# Patient Record
Sex: Female | Born: 1955 | Race: White | Hispanic: No | Marital: Single | Smoking: Former smoker
Health system: Southern US, Community
[De-identification: ages and names within clinical notes are randomized; demographics above are authoritative.]

## PROBLEM LIST (undated history)

## (undated) DIAGNOSIS — E785 Hyperlipidemia, unspecified: Secondary | ICD-10-CM

## (undated) DIAGNOSIS — M810 Age-related osteoporosis without current pathological fracture: Secondary | ICD-10-CM

## (undated) DIAGNOSIS — I872 Venous insufficiency (chronic) (peripheral): Secondary | ICD-10-CM

## (undated) DIAGNOSIS — I809 Phlebitis and thrombophlebitis of unspecified site: Secondary | ICD-10-CM

## (undated) DIAGNOSIS — C801 Malignant (primary) neoplasm, unspecified: Secondary | ICD-10-CM

## (undated) DIAGNOSIS — K219 Gastro-esophageal reflux disease without esophagitis: Secondary | ICD-10-CM

## (undated) HISTORY — DX: Venous insufficiency (chronic) (peripheral): I87.2

## (undated) HISTORY — DX: Hyperlipidemia, unspecified: E78.5

## (undated) HISTORY — DX: Age-related osteoporosis without current pathological fracture: M81.0

## (undated) HISTORY — DX: Phlebitis and thrombophlebitis of unspecified site: I80.9

## (undated) HISTORY — DX: Gastro-esophageal reflux disease without esophagitis: K21.9

---

## 1981-12-05 DIAGNOSIS — I809 Phlebitis and thrombophlebitis of unspecified site: Secondary | ICD-10-CM

## 1981-12-05 HISTORY — DX: Phlebitis and thrombophlebitis of unspecified site: I80.9

## 2004-11-25 ENCOUNTER — Ambulatory Visit: Payer: Self-pay | Admitting: Obstetrics and Gynecology

## 2005-12-05 HISTORY — PX: CHOLECYSTECTOMY: SHX55

## 2005-12-05 HISTORY — PX: LAPAROSCOPY: SHX197

## 2005-12-27 ENCOUNTER — Ambulatory Visit: Payer: Self-pay | Admitting: Obstetrics and Gynecology

## 2006-06-02 ENCOUNTER — Emergency Department: Payer: Self-pay | Admitting: Emergency Medicine

## 2006-06-05 ENCOUNTER — Other Ambulatory Visit: Payer: Self-pay

## 2006-06-05 ENCOUNTER — Emergency Department: Payer: Self-pay | Admitting: Emergency Medicine

## 2006-06-06 ENCOUNTER — Ambulatory Visit: Payer: Self-pay | Admitting: Emergency Medicine

## 2006-07-12 ENCOUNTER — Ambulatory Visit: Payer: Self-pay | Admitting: Family Medicine

## 2006-07-31 ENCOUNTER — Ambulatory Visit: Payer: Self-pay | Admitting: Gastroenterology

## 2006-09-29 ENCOUNTER — Ambulatory Visit: Payer: Self-pay | Admitting: General Surgery

## 2006-10-12 ENCOUNTER — Ambulatory Visit: Payer: Self-pay | Admitting: General Surgery

## 2006-11-14 ENCOUNTER — Ambulatory Visit: Payer: Self-pay | Admitting: General Surgery

## 2006-12-05 HISTORY — PX: COLONOSCOPY: SHX174

## 2007-10-29 ENCOUNTER — Ambulatory Visit: Payer: Self-pay | Admitting: Obstetrics and Gynecology

## 2007-12-21 ENCOUNTER — Ambulatory Visit: Payer: Self-pay | Admitting: Gastroenterology

## 2008-03-01 ENCOUNTER — Ambulatory Visit: Payer: Self-pay | Admitting: Internal Medicine

## 2008-11-05 ENCOUNTER — Ambulatory Visit: Payer: Self-pay | Admitting: Obstetrics and Gynecology

## 2009-11-30 ENCOUNTER — Ambulatory Visit: Payer: Self-pay | Admitting: Obstetrics and Gynecology

## 2010-01-12 ENCOUNTER — Ambulatory Visit: Payer: Self-pay | Admitting: Otolaryngology

## 2010-12-02 ENCOUNTER — Ambulatory Visit: Payer: Self-pay | Admitting: Family Medicine

## 2011-05-12 ENCOUNTER — Ambulatory Visit: Payer: Self-pay | Admitting: Family Medicine

## 2011-05-28 ENCOUNTER — Ambulatory Visit: Payer: Self-pay | Admitting: Internal Medicine

## 2011-07-05 DIAGNOSIS — E7800 Pure hypercholesterolemia, unspecified: Secondary | ICD-10-CM | POA: Insufficient documentation

## 2011-07-05 DIAGNOSIS — M8580 Other specified disorders of bone density and structure, unspecified site: Secondary | ICD-10-CM | POA: Insufficient documentation

## 2011-07-05 DIAGNOSIS — F17200 Nicotine dependence, unspecified, uncomplicated: Secondary | ICD-10-CM | POA: Insufficient documentation

## 2011-07-05 DIAGNOSIS — K219 Gastro-esophageal reflux disease without esophagitis: Secondary | ICD-10-CM | POA: Insufficient documentation

## 2011-12-21 ENCOUNTER — Ambulatory Visit: Payer: Self-pay | Admitting: Family Medicine

## 2012-03-18 ENCOUNTER — Ambulatory Visit: Payer: Self-pay | Admitting: Family Medicine

## 2013-04-17 ENCOUNTER — Ambulatory Visit: Payer: Self-pay | Admitting: Family Medicine

## 2013-04-30 ENCOUNTER — Ambulatory Visit: Payer: Self-pay | Admitting: Family Medicine

## 2013-05-02 ENCOUNTER — Encounter: Payer: Self-pay | Admitting: *Deleted

## 2013-08-13 ENCOUNTER — Ambulatory Visit: Payer: Self-pay | Admitting: General Surgery

## 2013-08-29 ENCOUNTER — Ambulatory Visit: Payer: Self-pay | Admitting: General Surgery

## 2014-04-22 ENCOUNTER — Ambulatory Visit: Payer: Self-pay | Admitting: Family Medicine

## 2014-05-13 ENCOUNTER — Ambulatory Visit: Payer: Self-pay | Admitting: Family Medicine

## 2014-06-17 DIAGNOSIS — J449 Chronic obstructive pulmonary disease, unspecified: Secondary | ICD-10-CM | POA: Insufficient documentation

## 2015-04-02 ENCOUNTER — Other Ambulatory Visit: Payer: Self-pay

## 2015-04-02 DIAGNOSIS — Z1231 Encounter for screening mammogram for malignant neoplasm of breast: Secondary | ICD-10-CM

## 2015-04-16 ENCOUNTER — Other Ambulatory Visit: Payer: Self-pay | Admitting: Family Medicine

## 2015-04-16 DIAGNOSIS — Z780 Asymptomatic menopausal state: Secondary | ICD-10-CM

## 2015-04-28 ENCOUNTER — Ambulatory Visit
Admission: RE | Admit: 2015-04-28 | Discharge: 2015-04-28 | Disposition: A | Discharge status: Home / Self Care | Payer: 59 | Source: Ambulatory Visit | Attending: Family Medicine | Admitting: Family Medicine

## 2015-04-28 ENCOUNTER — Ambulatory Visit: Payer: Self-pay

## 2015-04-28 ENCOUNTER — Ambulatory Visit: Admission: RE | Admit: 2015-04-28 | Payer: Self-pay | Source: Ambulatory Visit

## 2015-04-28 DIAGNOSIS — Z1231 Encounter for screening mammogram for malignant neoplasm of breast: Secondary | ICD-10-CM | POA: Diagnosis not present

## 2015-04-28 DIAGNOSIS — Z780 Asymptomatic menopausal state: Secondary | ICD-10-CM | POA: Diagnosis present

## 2015-04-28 DIAGNOSIS — M8580 Other specified disorders of bone density and structure, unspecified site: Secondary | ICD-10-CM | POA: Diagnosis not present

## 2015-04-28 DIAGNOSIS — Z803 Family history of malignant neoplasm of breast: Secondary | ICD-10-CM | POA: Diagnosis not present

## 2015-05-07 ENCOUNTER — Ambulatory Visit: Payer: Self-pay

## 2016-02-21 ENCOUNTER — Encounter: Payer: Self-pay | Admitting: Medical Oncology

## 2016-02-21 ENCOUNTER — Encounter: Payer: Self-pay | Admitting: Gynecology

## 2016-02-21 ENCOUNTER — Emergency Department: Payer: 59

## 2016-02-21 ENCOUNTER — Ambulatory Visit
Admission: EM | Admit: 2016-02-21 | Discharge: 2016-02-21 | Disposition: A | Discharge status: Other Acute Inpatient Hospital | Payer: 59 | Attending: Family Medicine | Admitting: Family Medicine

## 2016-02-21 ENCOUNTER — Emergency Department
Admission: EM | Admit: 2016-02-21 | Discharge: 2016-02-21 | Disposition: A | Discharge status: Home / Self Care | Payer: 59 | Attending: Emergency Medicine | Admitting: Emergency Medicine

## 2016-02-21 DIAGNOSIS — F17200 Nicotine dependence, unspecified, uncomplicated: Secondary | ICD-10-CM | POA: Insufficient documentation

## 2016-02-21 DIAGNOSIS — R1030 Lower abdominal pain, unspecified: Secondary | ICD-10-CM | POA: Diagnosis not present

## 2016-02-21 DIAGNOSIS — K5730 Diverticulosis of large intestine without perforation or abscess without bleeding: Secondary | ICD-10-CM | POA: Insufficient documentation

## 2016-02-21 DIAGNOSIS — K5792 Diverticulitis of intestine, part unspecified, without perforation or abscess without bleeding: Secondary | ICD-10-CM

## 2016-02-21 DIAGNOSIS — R1032 Left lower quadrant pain: Secondary | ICD-10-CM | POA: Diagnosis present

## 2016-02-21 DIAGNOSIS — E785 Hyperlipidemia, unspecified: Secondary | ICD-10-CM | POA: Insufficient documentation

## 2016-02-21 DIAGNOSIS — Z79899 Other long term (current) drug therapy: Secondary | ICD-10-CM | POA: Diagnosis not present

## 2016-02-21 LAB — URINALYSIS COMPLETE WITH MICROSCOPIC (ARMC ONLY)
Bacteria, UA: NONE SEEN
Bilirubin Urine: NEGATIVE
Bilirubin Urine: NEGATIVE
Glucose, UA: NEGATIVE mg/dL
Glucose, UA: NEGATIVE mg/dL
HGB URINE DIPSTICK: NEGATIVE
HGB URINE DIPSTICK: NEGATIVE
KETONES UR: NEGATIVE mg/dL
Ketones, ur: NEGATIVE mg/dL
LEUKOCYTES UA: NEGATIVE
NITRITE: NEGATIVE
Nitrite: NEGATIVE
PH: 7.5 (ref 5.0–8.0)
PH: 7 (ref 5.0–8.0)
PROTEIN: NEGATIVE mg/dL
PROTEIN: NEGATIVE mg/dL
RBC / HPF: NONE SEEN RBC/hpf (ref 0–5)
RBC / HPF: NONE SEEN RBC/hpf (ref 0–5)
SPECIFIC GRAVITY, URINE: 1.015 (ref 1.005–1.030)
Specific Gravity, Urine: 1.004 — ABNORMAL LOW (ref 1.005–1.030)

## 2016-02-21 LAB — COMPREHENSIVE METABOLIC PANEL
ALT: 14 U/L (ref 14–54)
AST: 18 U/L (ref 15–41)
Albumin: 4.4 g/dL (ref 3.5–5.0)
Alkaline Phosphatase: 94 U/L (ref 38–126)
Anion gap: 5 (ref 5–15)
BUN: 11 mg/dL (ref 6–20)
CHLORIDE: 107 mmol/L (ref 101–111)
CO2: 28 mmol/L (ref 22–32)
CREATININE: 0.76 mg/dL (ref 0.44–1.00)
Calcium: 9 mg/dL (ref 8.9–10.3)
Glucose, Bld: 95 mg/dL (ref 65–99)
POTASSIUM: 4 mmol/L (ref 3.5–5.1)
Sodium: 140 mmol/L (ref 135–145)
Total Bilirubin: 0.6 mg/dL (ref 0.3–1.2)
Total Protein: 7.2 g/dL (ref 6.5–8.1)

## 2016-02-21 LAB — CBC
HEMATOCRIT: 44.8 % (ref 35.0–47.0)
Hemoglobin: 15.6 g/dL (ref 12.0–16.0)
MCH: 29.7 pg (ref 26.0–34.0)
MCHC: 34.8 g/dL (ref 32.0–36.0)
MCV: 85.2 fL (ref 80.0–100.0)
PLATELETS: 212 10*3/uL (ref 150–440)
RBC: 5.26 MIL/uL — AB (ref 3.80–5.20)
RDW: 13.2 % (ref 11.5–14.5)
WBC: 6.2 10*3/uL (ref 3.6–11.0)

## 2016-02-21 LAB — LIPASE, BLOOD: LIPASE: 20 U/L (ref 11–51)

## 2016-02-21 MED ORDER — IOHEXOL 240 MG/ML SOLN
25.0000 mL | Freq: Once | INTRAMUSCULAR | Status: AC | PRN
  Administered 2016-02-21: 25 mL via ORAL

## 2016-02-21 MED ORDER — CIPROFLOXACIN HCL 500 MG PO TABS: 500.0000 mg | ORAL_TABLET | Freq: Two times a day (BID) | ORAL | Status: DC

## 2016-02-21 MED ORDER — CIPROFLOXACIN HCL 500 MG PO TABS
750.0000 mg | ORAL_TABLET | Freq: Once | ORAL | Status: AC
  Administered 2016-02-21: 750 mg via ORAL
  Filled 2016-02-21: qty 2

## 2016-02-21 MED ORDER — ACETAMINOPHEN 500 MG PO TABS
1000.0000 mg | ORAL_TABLET | ORAL | Status: AC
  Administered 2016-02-21: 1000 mg via ORAL
  Filled 2016-02-21: qty 2

## 2016-02-21 MED ORDER — IOHEXOL 300 MG/ML  SOLN
100.0000 mL | Freq: Once | INTRAMUSCULAR | Status: AC | PRN
  Administered 2016-02-21: 100 mL via INTRAVENOUS

## 2016-02-21 MED ORDER — METRONIDAZOLE 500 MG PO TABS
500.0000 mg | ORAL_TABLET | Freq: Once | ORAL | Status: AC
  Administered 2016-02-21: 500 mg via ORAL
  Filled 2016-02-21: qty 1

## 2016-02-21 MED ORDER — ONDANSETRON HCL 4 MG/2ML IJ SOLN
4.0000 mg | Freq: Four times a day (QID) | INTRAMUSCULAR | Status: DC | PRN
  Administered 2016-02-21: 4 mg via INTRAVENOUS
  Filled 2016-02-21: qty 2

## 2016-02-21 MED ORDER — SODIUM CHLORIDE 0.9 % IV BOLUS (SEPSIS)
1000.0000 mL | Freq: Once | INTRAVENOUS | Status: AC
  Administered 2016-02-21: 1000 mL via INTRAVENOUS

## 2016-02-21 MED ORDER — METRONIDAZOLE 500 MG PO TABS: 500.0000 mg | ORAL_TABLET | Freq: Three times a day (TID) | ORAL | Status: DC

## 2016-02-21 NOTE — ED Notes (Signed)
Patient transported to CT 

## 2016-02-21 NOTE — Discharge Instructions (Signed)
You were seen in the emergency room for abdominal pain. It is important that you follow up closely with your primary care doctor in the next couple of days.  Please return to the emergency room right away if you are to develop a fever, severe nausea, your pain becomes severe or worsens, you are unable to keep food down, begin vomiting any dark or bloody fluid, you develop any dark or bloody stools, feel dehydrated, or other new concerns or symptoms arise.  Diverticulitis Diverticulitis is inflammation or infection of small pouches in your colon that form when you have a condition called diverticulosis. The pouches in your colon are called diverticula. Your colon, or large intestine, is where water is absorbed and stool is formed. Complications of diverticulitis can include:  Bleeding.  Severe infection.  Severe pain.  Perforation of your colon.  Obstruction of your colon. CAUSES  Diverticulitis is caused by bacteria. Diverticulitis happens when stool becomes trapped in diverticula. This allows bacteria to grow in the diverticula, which can lead to inflammation and infection. RISK FACTORS People with diverticulosis are at risk for diverticulitis. Eating a diet that does not include enough fiber from fruits and vegetables may make diverticulitis more likely to develop. SYMPTOMS  Symptoms of diverticulitis may include:  Abdominal pain and tenderness. The pain is normally located on the left side of the abdomen, but may occur in other areas.  Fever and chills.  Bloating.  Cramping.  Nausea.  Vomiting.  Constipation.  Diarrhea.  Blood in your stool. DIAGNOSIS  Your health care provider will ask you about your medical history and do a physical exam. You may need to have tests done because many medical conditions can cause the same symptoms as diverticulitis. Tests may include:  Blood tests.  Urine tests.  Imaging tests of the abdomen, including X-rays and CT scans. When  your condition is under control, your health care provider may recommend that you have a colonoscopy. A colonoscopy can show how severe your diverticula are and whether something else is causing your symptoms. TREATMENT  Most cases of diverticulitis are mild and can be treated at home. Treatment may include:  Taking over-the-counter pain medicines.  Following a clear liquid diet.  Taking antibiotic medicines by mouth for 7-10 days. More severe cases may be treated at a hospital. Treatment may include:  Not eating or drinking.  Taking prescription pain medicine.  Receiving antibiotic medicines through an IV tube.  Receiving fluids and nutrition through an IV tube.  Surgery. HOME CARE INSTRUCTIONS   Follow your health care provider's instructions carefully.  Follow a full liquid diet or other diet as directed by your health care provider. After your symptoms improve, your health care provider may tell you to change your diet. He or she may recommend you eat a high-fiber diet. Fruits and vegetables are good sources of fiber. Fiber makes it easier to pass stool.  Take fiber supplements or probiotics as directed by your health care provider.  Only take medicines as directed by your health care provider.  Keep all your follow-up appointments. SEEK MEDICAL CARE IF:   Your pain does not improve.  You have a hard time eating food.  Your bowel movements do not return to normal. SEEK IMMEDIATE MEDICAL CARE IF:   Your pain becomes worse.  Your symptoms do not get better.  Your symptoms suddenly get worse.  You have a fever.  You have repeated vomiting.  You have bloody or black, tarry stools. MAKE SURE  YOU:   Understand these instructions.  Will watch your condition.  Will get help right away if you are not doing well or get worse.   This information is not intended to replace advice given to you by your health care provider. Make sure you discuss any questions you have  with your health care provider.   Document Released: 08/31/2005 Document Revised: 11/26/2013 Document Reviewed: 10/16/2013 Elsevier Interactive Patient Education [employer].

## 2016-02-21 NOTE — ED Notes (Signed)
Pt reports left lower abd pain since Thursday, was seen at [facility] urgent care and sent here for possible appendicitis. Upon assessment by PA it was noted that she had rebound tenderness to Rt lower abd. Pt denies NVD. Denies fever.

## 2016-02-21 NOTE — ED Notes (Signed)
Patient c/o left flank pain x 4 days.

## 2016-02-21 NOTE — ED Provider Notes (Signed)
CSN: [id number]     Arrival date & time 02/21/16  1119 History   First MD Initiated Contact with Patient 02/21/16 1358     Chief Complaint  Patient presents with  . Flank Pain   (Consider location/radiation/quality/duration/timing/severity/associated sxs/prior Treatment) HPI Comments: 60 yo female with a c/o progressively worsening lower left abdominal pain, associated with intermittent chills. Denies any fevers, vomiting, melena, hematochezia, dysuria, hematuria. Last normal BM was yesterday.    Past Medical History  Diagnosis Date  . Osteoporosis   . Unspecified venous (peripheral) insufficiency   . Hyperlipidemia   . GERD (gastroesophageal reflux disease)   . Acid reflux   . Thrombophlebitis 1983   Past Surgical History  Procedure Laterality Date  . Cholecystectomy  2007  . Colonoscopy  2008  . Laparoscopy  2007   Family History  Problem Relation Age of Onset  . Breast cancer Mother 63   Social History  Substance Use Topics  . Smoking status: Current Every Day Smoker -- 1.00 packs/day for 30 years  . Smokeless tobacco: Never Used  . Alcohol Use: Yes   OB History    No data available     Review of Systems  Allergies  Codeine  Home Medications   Prior to Admission medications   Medication Sig Start Date End Date Taking? Authorizing Provider  albuterol (PROVENTIL HFA;VENTOLIN HFA) 108 (90 Base) MCG/ACT inhaler Inhale into the lungs every 6 (six) hours as needed for wheezing or shortness of breath.   Yes Historical Provider, MD  Calcium Citrate-Vitamin D (CALCIUM + D PO) Take by mouth.   Yes Historical Provider, MD  mometasone (NASONEX) 50 MCG/ACT nasal spray Place 2 sprays into the nose daily.   Yes Historical Provider, MD  RaNITidine HCl (ZANTAC PO) Take by mouth.   Yes Historical Provider, MD   Meds Ordered and Administered this Visit  Medications - No data to display  BP 119/69 mmHg  Pulse 70  Temp(Src) 97.8 F (36.6 C) (Oral)  Resp 16  Ht 5\' 2"  (1.575  m)  Wt 138 lb (62.596 kg)  BMI 25.23 kg/m2  SpO2 98% No data found.   Physical Exam  Constitutional: She appears well-developed.  Cardiovascular: Normal rate, regular rhythm, normal heart sounds and intact distal pulses.   No murmur heard. Pulmonary/Chest: Effort normal and breath sounds normal. No respiratory distress. She has no wheezes. She has no rales.  Abdominal: Soft. Bowel sounds are normal. She exhibits no distension and no mass. There is tenderness (to palpation over McBurney's point with guarding; left lower quadrant tenderness to palpation). There is no rebound and no guarding.  Neurological: She is alert.  Skin: Skin is warm and dry. No rash noted. She is not diaphoretic. No erythema.  Nursing note and vitals reviewed.   ED Course  Procedures (including critical care time)  Labs Review Labs Reviewed  URINALYSIS COMPLETEWITH MICROSCOPIC ([facility] ONLY) - Abnormal; Notable for the following:    Color, Urine STRAW (*)    Leukocytes, UA TRACE (*)    Bacteria, UA RARE (*)    Squamous Epithelial / LPF 6-30 (*)    All other components within normal limits  URINE CULTURE    Imaging Review No results found.   Visual Acuity Review  Right Eye Distance:   Left Eye Distance:   Bilateral Distance:    Right Eye Near:   Left Eye Near:    Bilateral Near:         MDM   1. Lower  abdominal pain   (right greater than left)  1. UA lab results and possible etiologies reviewed with patient; recommend patient go to ED for further evaluation (possible [place] or CT) and management. Patient in stable condition transported by private vehicle to [facility] ED.   Norval Gable, MD 02/21/16 [phone]

## 2016-02-21 NOTE — ED Provider Notes (Signed)
[facility] Emergency Department Provider Note  ____________________________________________  Time seen: Approximately 3:41 PM  I have reviewed the triage vital signs and the nursing notes.   HISTORY  Chief Complaint Abdominal Pain    HPI HARUE SOLE is a 60 y.o. female porch is been having a fairly constant pain in the lower abdomen since Thursday.Seems to be slightly worsening over the last few days. Associated with not eating very well this morning. She's had some chills but no fever.  States she has an achy constant pain that seems to be in the left and lower abdomen. She also reports she went to urgent care and they thought she was very tender in the right lower abdomen.  No nausea or vomiting. No chest pain or trouble breathing. No trouble urinating. Denies any diarrhea or black or bloody stool.  The present time she does not wish for any strong pain medicine. She would like to have some Tylenol only for discomfort and does not wish for anything for nausea.  Past Medical History  Diagnosis Date  . Osteoporosis   . Unspecified venous (peripheral) insufficiency   . Hyperlipidemia   . GERD (gastroesophageal reflux disease)   . Acid reflux   . Thrombophlebitis 1983    There are no active problems to display for this patient.   Past Surgical History  Procedure Laterality Date  . Cholecystectomy  2007  . Colonoscopy  2008  . Laparoscopy  2007    Current Outpatient Rx  Name  Route  Sig  Dispense  Refill  . albuterol (PROVENTIL HFA;VENTOLIN HFA) 108 (90 Base) MCG/ACT inhaler   Inhalation   Inhale into the lungs every 6 (six) hours as needed for wheezing or shortness of breath.         . Calcium Citrate-Vitamin D (CALCIUM + D PO)   Oral   Take by mouth.         . ciprofloxacin (CIPRO) 500 MG tablet   Oral   Take 1 tablet (500 mg total) by mouth 2 (two) times daily.   20 tablet   0   . metroNIDAZOLE (FLAGYL) 500 MG tablet    Oral   Take 1 tablet (500 mg total) by mouth 3 (three) times daily.   30 tablet   0   . mometasone (NASONEX) 50 MCG/ACT nasal spray   Nasal   Place 2 sprays into the nose daily.         . RaNITidine HCl (ZANTAC PO)   Oral   Take by mouth.           Allergies Codeine  Family History  Problem Relation Age of Onset  . Breast cancer Mother 59    Social History Social History  Substance Use Topics  . Smoking status: Current Every Day Smoker -- 1.00 packs/day for 30 years  . Smokeless tobacco: Never Used  . Alcohol Use: Yes    Review of Systems Constitutional: Chills Eyes: No visual changes. ENT: No sore throat. Cardiovascular: Denies chest pain. Respiratory: Denies shortness of breath. Gastrointestinal: See history of present illness No nausea, no vomiting.  No diarrhea.  No constipation. Genitourinary: Negative for dysuria. Musculoskeletal: Negative for back pain. Skin: Negative for rash. Neurological: Negative for headaches, focal weakness or numbness.  10-point ROS otherwise negative.  ____________________________________________   PHYSICAL EXAM:  VITAL SIGNS: ED Triage Vitals  Enc Vitals Group     BP 02/21/16 1439 124/71 mmHg     Pulse Rate 02/21/16 1439  89     Resp 02/21/16 1439 18     Temp 02/21/16 1439 97.5 F (36.4 C)     Temp Source 02/21/16 1439 Oral     SpO2 02/21/16 1439 97 %     Weight 02/21/16 1439 138 lb (62.596 kg)     Height 02/21/16 1439 5\' 2"  (1.575 m)     Head Cir --      Peak Flow --      Pain Score 02/21/16 1440 7     Pain Loc --      Pain Edu? --      Excl. in [place]? --    Constitutional: Alert and oriented. Well appearing and in no acute distress. Very pleasant. Eyes: Conjunctivae are normal. PERRL. EOMI. Head: Atraumatic. Nose: No congestion/rhinnorhea. Mouth/Throat: Mucous membranes are slightly dry.  Oropharynx non-erythematous. Neck: No stridor.   Cardiovascular: Normal rate, regular rhythm. Grossly normal heart sounds.   Good peripheral circulation. Respiratory: Normal respiratory effort.  No retractions. Lungs CTAB. Gastrointestinal: Soft and nontender except for moderate tenderness worse over McBurney's point, and mild tenderness over the left lower quadrant. Questionable mild peritonitis in the right lower abdomen. No distention. No abdominal bruits. No CVA tenderness. Musculoskeletal: No lower extremity tenderness nor edema.  No joint effusions. Neurologic:  Normal speech and language. No gross focal neurologic deficits are appreciated. Skin:  Skin is warm, dry and intact. No rash noted. Psychiatric: Mood and affect are normal. Speech and behavior are normal.  ____________________________________________   LABS (all labs ordered are listed, but only abnormal results are displayed)  Labs Reviewed  CBC - Abnormal; Notable for the following:    RBC 5.26 (*)    All other components within normal limits  URINALYSIS COMPLETEWITH MICROSCOPIC ([facility] ONLY) - Abnormal; Notable for the following:    Color, Urine STRAW (*)    APPearance CLEAR (*)    Specific Gravity, Urine 1.004 (*)    Squamous Epithelial / LPF 0-5 (*)    All other components within normal limits  LIPASE, BLOOD  COMPREHENSIVE METABOLIC PANEL   ____________________________________________  EKG   ____________________________________________  RADIOLOGY  CT Abdomen Pelvis W Contrast (Final result) Result time: 02/21/16 17:53:57    Final result by Rad Results In Interface (02/21/16 17:53:57)    Narrative:   CLINICAL DATA: Lower abdomen and pelvic pain, greater on the right. Previous cholecystectomy.  EXAM: CT ABDOMEN AND PELVIS WITH CONTRAST  TECHNIQUE: Multidetector CT imaging of the abdomen and pelvis was performed using the standard protocol following bolus administration of intravenous contrast.  CONTRAST: 174mL OMNIPAQUE IOHEXOL 300 MG/ML SOLN  COMPARISON: None.  FINDINGS: Lower chest: The lungs are hyperexpanded with  diffuse vascular cannulation and mild diffuse peribronchial thickening.  Hepatobiliary: Cholecystectomy clips. Mild diffuse low density of the liver relative to the spleen.  Pancreas: No mass, inflammatory changes, or other significant abnormality.  Spleen: Within normal limits in size and appearance.  Adrenals/Urinary Tract: No masses identified. No evidence of hydronephrosis.  Stomach/Bowel: Multiple sigmoid colon diverticula without evidence of diverticulitis. Normal appearing appendix. No gastric or small bowel abnormalities.  Vascular/Lymphatic: Mild atheromatous arterial calcifications. No enlarged lymph nodes.  Reproductive: No mass or other significant abnormality.  Other: None.  Musculoskeletal: Mild lumbar spine degenerative changes.  IMPRESSION: 1. No acute abnormality. 2. Sigmoid diverticulosis without evidence of diverticulitis. 3. Mild diffuse hepatic steatosis. 4. Changes of COPD and chronic bronchitis.   Electronically Signed By: Claudie Revering M.D. On: 02/21/2016 17:53  ____________________________________________   PROCEDURES  Procedure(s) performed: None  Critical Care performed: No  ____________________________________________   INITIAL IMPRESSION / ASSESSMENT AND PLAN / ED COURSE  Pertinent labs & imaging results that were available during my care of the patient were reviewed by me and considered in my medical decision making (see chart for details).  Lower abdominal pain seemingly worse on the right by exam. Some associated chills. Labs at urgent care very reassuring with normal white count, normal LFTs and lipase.  Patient does have focal tenderness primarily in the right lower abdomen and also some in the left. Differential diagnosis includes but is not limited to, abdominal perforation, aortic dissection, cholecystitis, appendicitis, diverticulitis, colitis, esophagitis/gastritis, kidney stone, pyelonephritis, urinary  tract infection, aortic aneurysm. All are considered in decision and treatment plan. Based upon the patient's presentation and risk factors, I believe the possibility of diverticulitis or appendicitis in size. She is status post cholecystectomy. We'll obtain CT imaging to further evaluate. Also send urinalysis as the results from urgent care not available.  ----------------------------------------- 6:54 PM on 02/21/2016 -----------------------------------------  Patient comfortable. No distress. The CT does not demonstrate clear evidence of diverticulitis, her exam and history and given she has known diverticulosis could possibly support diverticulitis which is mild without obvious CT findings as up to 10-20% of which she diverticulitis may not have clear CT findings.  I will treat her with Cipro and Flagyl. Discussed careful return precautions to the patient is agreeable.  Return precautions and treatment recommendations and follow-up discussed with the patient who is agreeable with the plan.   ____________________________________________   FINAL CLINICAL IMPRESSION(S) / ED DIAGNOSES  Final diagnoses:  Diverticulosis of large intestine without hemorrhage  Diverticulitis of intestine without perforation or abscess without bleeding      Delman Kitten, MD 02/21/16 [phone]

## 2016-02-21 NOTE — ED Notes (Signed)
Report called to Marya Amsler charge nurse at [facility] ED.

## 2016-02-23 LAB — URINE CULTURE

## 2016-02-29 ENCOUNTER — Emergency Department
Admission: EM | Admit: 2016-02-29 | Discharge: 2016-03-01 | Disposition: A | Discharge status: Home / Self Care | Payer: 59 | Attending: Emergency Medicine | Admitting: Emergency Medicine

## 2016-02-29 DIAGNOSIS — Z79899 Other long term (current) drug therapy: Secondary | ICD-10-CM | POA: Diagnosis not present

## 2016-02-29 DIAGNOSIS — F17200 Nicotine dependence, unspecified, uncomplicated: Secondary | ICD-10-CM | POA: Insufficient documentation

## 2016-02-29 DIAGNOSIS — R1032 Left lower quadrant pain: Secondary | ICD-10-CM | POA: Diagnosis present

## 2016-02-29 DIAGNOSIS — B029 Zoster without complications: Secondary | ICD-10-CM | POA: Insufficient documentation

## 2016-02-29 DIAGNOSIS — Z792 Long term (current) use of antibiotics: Secondary | ICD-10-CM | POA: Insufficient documentation

## 2016-02-29 LAB — COMPREHENSIVE METABOLIC PANEL
ALT: 35 U/L (ref 14–54)
AST: 43 U/L — AB (ref 15–41)
Albumin: 4.2 g/dL (ref 3.5–5.0)
Alkaline Phosphatase: 79 U/L (ref 38–126)
Anion gap: 7 (ref 5–15)
BILIRUBIN TOTAL: 0.8 mg/dL (ref 0.3–1.2)
BUN: 6 mg/dL (ref 6–20)
CALCIUM: 8.5 mg/dL — AB (ref 8.9–10.3)
CO2: 28 mmol/L (ref 22–32)
CREATININE: 0.74 mg/dL (ref 0.44–1.00)
Chloride: 100 mmol/L — ABNORMAL LOW (ref 101–111)
GFR calc Af Amer: >60 mL/min (ref >60)
Glucose, Bld: 100 mg/dL — ABNORMAL HIGH (ref 65–99)
Potassium: 3.7 mmol/L (ref 3.5–5.1)
Sodium: 135 mmol/L (ref 135–145)
TOTAL PROTEIN: 6.9 g/dL (ref 6.5–8.1)

## 2016-02-29 LAB — URINALYSIS COMPLETE WITH MICROSCOPIC (ARMC ONLY)
BILIRUBIN URINE: NEGATIVE
Bacteria, UA: NONE SEEN
GLUCOSE, UA: NEGATIVE mg/dL
Hgb urine dipstick: NEGATIVE
KETONES UR: NEGATIVE mg/dL
Leukocytes, UA: NEGATIVE
Nitrite: NEGATIVE
Protein, ur: NEGATIVE mg/dL
SPECIFIC GRAVITY, URINE: 1.001 — AB (ref 1.005–1.030)
pH: 7 (ref 5.0–8.0)

## 2016-02-29 LAB — CBC
HCT: 43.4 % (ref 35.0–47.0)
HEMOGLOBIN: 14.8 g/dL (ref 12.0–16.0)
MCH: 28.7 pg (ref 26.0–34.0)
MCHC: 34.2 g/dL (ref 32.0–36.0)
MCV: 84.2 fL (ref 80.0–100.0)
Platelets: 156 10*3/uL (ref 150–440)
RBC: 5.16 MIL/uL (ref 3.80–5.20)
RDW: 13.6 % (ref 11.5–14.5)
WBC: 4.8 10*3/uL (ref 3.6–11.0)

## 2016-02-29 LAB — LIPASE, BLOOD: Lipase: 43 U/L (ref 11–51)

## 2016-02-29 NOTE — ED Notes (Signed)
Pt in with co left sided abd pain was seen here for the same a week ago and sent home with antibiotics and dx with diverticulitis.  .  Is here for same symptoms, has diarrhea, no fever.

## 2016-03-01 MED ORDER — ONDANSETRON 8 MG PO TBDP
8.0000 mg | ORAL_TABLET | Freq: Once | ORAL | Status: AC
  Administered 2016-03-01: 8 mg via ORAL
  Filled 2016-03-01: qty 1

## 2016-03-01 MED ORDER — OXYCODONE-ACETAMINOPHEN 5-325 MG PO TABS
1.0000 | ORAL_TABLET | Freq: Once | ORAL | Status: AC
  Administered 2016-03-01: 1 via ORAL
  Filled 2016-03-01: qty 1

## 2016-03-01 MED ORDER — ONDANSETRON 8 MG PO TBDP: 8.0000 mg | ORAL_TABLET | Freq: Three times a day (TID) | ORAL | Status: DC | PRN

## 2016-03-01 MED ORDER — OXYCODONE-ACETAMINOPHEN 5-325 MG PO TABS: 1.0000 | ORAL_TABLET | ORAL | Status: DC | PRN

## 2016-03-01 NOTE — ED Notes (Signed)
Pt c/o left sided pain and the pt reports that she had shingles from the diverticulitis that they dx her with here in the er last week - Shingles are "drying up" - Pressure on the abd causes discomfort to pt - MD Owens Shark is at bedside assessing pt at this time

## 2016-03-01 NOTE — Discharge Instructions (Signed)

## 2016-03-01 NOTE — ED Provider Notes (Signed)
[facility] Emergency Department Provider Note  ____________________________________________  Time seen: 12:15 AM  I have reviewed the triage vital signs and the nursing notes.   HISTORY  Chief Complaint Abdominal Pain     HPI Natasha [name] is a 60 y.o. female returns to the emergency department with left lower quadrant/left flank and burning shooting pain. Patient was seen on 02/21/2016 and diagnosed with diverticulitis. Patient states that 2 days later she went to see her primary care provider Dr. Hoy Morn after noticing a rash that was consistent with shingles on her left flank. Dr. Hoy Morn prescribed antiviral as well as Vicodin. Patient states Vicodin improved her pain made her go to sleep however upon awakening she had nonbloody emesis and a such discontinue taking the Vicodin.     Past Medical History  Diagnosis Date  . Osteoporosis   . Unspecified venous (peripheral) insufficiency   . Hyperlipidemia   . GERD (gastroesophageal reflux disease)   . Acid reflux   . Thrombophlebitis 1983    There are no active problems to display for this patient.   Past Surgical History  Procedure Laterality Date  . Cholecystectomy  2007  . Colonoscopy  2008  . Laparoscopy  2007    Current Outpatient Rx  Name  Route  Sig  Dispense  Refill  . albuterol (PROVENTIL HFA;VENTOLIN HFA) 108 (90 Base) MCG/ACT inhaler   Inhalation   Inhale into the lungs every 6 (six) hours as needed for wheezing or shortness of breath.         . Calcium Citrate-Vitamin D (CALCIUM + D PO)   Oral   Take by mouth.         . ciprofloxacin (CIPRO) 500 MG tablet   Oral   Take 1 tablet (500 mg total) by mouth 2 (two) times daily.   20 tablet   0   . metroNIDAZOLE (FLAGYL) 500 MG tablet   Oral   Take 1 tablet (500 mg total) by mouth 3 (three) times daily.   30 tablet   0   . mometasone (NASONEX) 50 MCG/ACT nasal spray   Nasal   Place 2 sprays into the nose daily.        . RaNITidine HCl (ZANTAC PO)   Oral   Take by mouth.           Allergies Codeine  Family History  Problem Relation Age of Onset  . Breast cancer Mother 88    Social History Social History  Substance Use Topics  . Smoking status: Current Every Day Smoker -- 1.00 packs/day for 30 years  . Smokeless tobacco: Never Used  . Alcohol Use: Yes    Review of Systems  Constitutional: Negative for fever. Eyes: Negative for visual changes. ENT: Negative for sore throat. Cardiovascular: Negative for chest pain. Respiratory: Negative for shortness of breath. Gastrointestinal: Negative for abdominal pain, vomiting and diarrhea. Genitourinary: Negative for dysuria. Musculoskeletal: Negative for back pain. Skin: Positive for rash. Neurological: Negative for headaches, focal weakness or numbness.   10-point ROS otherwise negative.  ____________________________________________   PHYSICAL EXAM:  VITAL SIGNS: ED Triage Vitals  Enc Vitals Group     BP 02/29/16 2105 122/80 mmHg     Pulse Rate 02/29/16 2105 84     Resp 02/29/16 2105 18     Temp 02/29/16 2105 98 F (36.7 C)     Temp Source 02/29/16 2105 Oral     SpO2 02/29/16 2105 93 %     Weight  02/29/16 2105 136 lb (61.689 kg)     Height 02/29/16 2105 5\' 2"  (1.575 m)     Head Cir --      Peak Flow --      Pain Score 02/29/16 2106 8     Pain Loc --      Pain Edu? --      Excl. in [place]? --      Constitutional: Alert and oriented. Well appearing and in no distress. Eyes: Conjunctivae are normal. PERRL. Normal extraocular movements. ENT   Head: Normocephalic and atraumatic.   Nose: No congestion/rhinnorhea.   Mouth/Throat: Mucous membranes are moist.   Neck: No stridor. Hematological/Lymphatic/Immunilogical: No cervical lymphadenopathy. Cardiovascular: Normal rate, regular rhythm. Normal and symmetric distal pulses are present in all extremities. No murmurs, rubs, or gallops. Respiratory: Normal  respiratory effort without tachypnea nor retractions. Breath sounds are clear and equal bilaterally. No wheezes/rales/rhonchi. Gastrointestinal: Soft and nontender. No distention. There is no CVA tenderness. Genitourinary: deferred Musculoskeletal: Nontender with normal range of motion in all extremities. No joint effusions.  No lower extremity tenderness nor edema. Neurologic:  Normal speech and language. No gross focal neurologic deficits are appreciated.  Skin: Dermatomal rash noted to the left flank consistent with shingles Psychiatric: Mood and affect are normal. Speech and behavior are normal. Patient exhibits appropriate insight and judgment.  ____________________________________________    LABS (pertinent positives/negatives)  Labs Reviewed  COMPREHENSIVE METABOLIC PANEL - Abnormal; Notable for the following:    Chloride 100 (*)    Glucose, Bld 100 (*)    Calcium 8.5 (*)    AST 43 (*)    All other components within normal limits  URINALYSIS COMPLETEWITH MICROSCOPIC ([facility] ONLY) - Abnormal; Notable for the following:    Color, Urine STRAW (*)    APPearance CLEAR (*)    Specific Gravity, Urine 1.001 (*)    Squamous Epithelial / LPF 0-5 (*)    All other components within normal limits  CBC  LIPASE, BLOOD       INITIAL IMPRESSION / ASSESSMENT AND PLAN / ED COURSE  Pertinent labs & imaging results that were available during my care of the patient were reviewed by me and considered in my medical decision making (see chart for details).  History of physical exam consistent with shingles. Patient received Zofran and Percocet emergency department will be prescribed same at home.  ____________________________________________   FINAL CLINICAL IMPRESSION(S) / ED DIAGNOSES  Final diagnoses:  Shingles      Gregor Hams, MD 03/01/16 [phone]

## 2016-03-07 ENCOUNTER — Ambulatory Visit
Admission: RE | Admit: 2016-03-07 | Discharge: 2016-03-07 | Disposition: A | Discharge status: Home / Self Care | Payer: 59 | Source: Ambulatory Visit | Attending: Family Medicine | Admitting: Family Medicine

## 2016-03-07 ENCOUNTER — Other Ambulatory Visit: Payer: Self-pay | Admitting: Family Medicine

## 2016-03-07 DIAGNOSIS — M79605 Pain in left leg: Secondary | ICD-10-CM

## 2016-03-07 DIAGNOSIS — R609 Edema, unspecified: Secondary | ICD-10-CM | POA: Insufficient documentation

## 2016-03-07 DIAGNOSIS — I8392 Asymptomatic varicose veins of left lower extremity: Secondary | ICD-10-CM | POA: Diagnosis not present

## 2016-03-31 ENCOUNTER — Other Ambulatory Visit
Admission: RE | Admit: 2016-03-31 | Discharge: 2016-03-31 | Disposition: A | Discharge status: Home / Self Care | Payer: 59 | Source: Ambulatory Visit | Attending: Nurse Practitioner | Admitting: Nurse Practitioner

## 2016-03-31 DIAGNOSIS — R1084 Generalized abdominal pain: Secondary | ICD-10-CM | POA: Diagnosis not present

## 2016-03-31 DIAGNOSIS — Z8719 Personal history of other diseases of the digestive system: Secondary | ICD-10-CM | POA: Diagnosis present

## 2016-03-31 DIAGNOSIS — A09 Infectious gastroenteritis and colitis, unspecified: Secondary | ICD-10-CM | POA: Diagnosis present

## 2016-03-31 LAB — GASTROINTESTINAL PANEL BY PCR, STOOL (REPLACES STOOL CULTURE)
ADENOVIRUS F40/41: NOT DETECTED
ASTROVIRUS: NOT DETECTED
CRYPTOSPORIDIUM: NOT DETECTED
Campylobacter species: NOT DETECTED
Cyclospora cayetanensis: NOT DETECTED
E. coli O157: NOT DETECTED
ENTEROAGGREGATIVE E COLI (EAEC): NOT DETECTED
ENTEROPATHOGENIC E COLI (EPEC): NOT DETECTED
ENTEROTOXIGENIC E COLI (ETEC): NOT DETECTED
Entamoeba histolytica: NOT DETECTED
GIARDIA LAMBLIA: NOT DETECTED
Norovirus GI/GII: NOT DETECTED
PLESIMONAS SHIGELLOIDES: NOT DETECTED
ROTAVIRUS A: NOT DETECTED
SHIGA LIKE TOXIN PRODUCING E COLI (STEC): NOT DETECTED
Salmonella species: NOT DETECTED
Sapovirus (I, II, IV, and V): NOT DETECTED
Shigella/Enteroinvasive E coli (EIEC): NOT DETECTED
VIBRIO SPECIES: NOT DETECTED
Vibrio cholerae: NOT DETECTED
YERSINIA ENTEROCOLITICA: NOT DETECTED

## 2016-03-31 LAB — C DIFFICILE QUICK SCREEN W PCR REFLEX
C Diff antigen: POSITIVE — AB
C Diff interpretation: POSITIVE
C Diff toxin: POSITIVE — AB

## 2016-05-31 ENCOUNTER — Other Ambulatory Visit: Payer: Self-pay | Admitting: Family Medicine

## 2016-05-31 DIAGNOSIS — Z1231 Encounter for screening mammogram for malignant neoplasm of breast: Secondary | ICD-10-CM

## 2016-06-13 ENCOUNTER — Ambulatory Visit
Admission: RE | Admit: 2016-06-13 | Discharge: 2016-06-13 | Disposition: A | Discharge status: Home / Self Care | Payer: 59 | Source: Ambulatory Visit | Attending: Family Medicine | Admitting: Family Medicine

## 2016-06-13 DIAGNOSIS — Z1231 Encounter for screening mammogram for malignant neoplasm of breast: Secondary | ICD-10-CM | POA: Insufficient documentation

## 2016-06-13 HISTORY — DX: Malignant (primary) neoplasm, unspecified: C80.1

## 2017-04-25 ENCOUNTER — Other Ambulatory Visit: Payer: Self-pay | Admitting: Family Medicine

## 2017-04-25 DIAGNOSIS — Z1231 Encounter for screening mammogram for malignant neoplasm of breast: Secondary | ICD-10-CM

## 2017-06-14 ENCOUNTER — Ambulatory Visit
Admission: RE | Admit: 2017-06-14 | Discharge: 2017-06-14 | Disposition: A | Discharge status: Home / Self Care | Payer: 59 | Source: Ambulatory Visit | Attending: Family Medicine | Admitting: Family Medicine

## 2017-06-14 DIAGNOSIS — Z1231 Encounter for screening mammogram for malignant neoplasm of breast: Secondary | ICD-10-CM | POA: Insufficient documentation

## 2018-01-09 ENCOUNTER — Encounter: Payer: Self-pay | Admitting: General Surgery

## 2018-01-11 ENCOUNTER — Encounter: Payer: Self-pay | Admitting: General Surgery

## 2018-02-01 ENCOUNTER — Other Ambulatory Visit
Admission: RE | Admit: 2018-02-01 | Discharge: 2018-02-01 | Disposition: A | Discharge status: Home / Self Care | Payer: 59 | Source: Ambulatory Visit | Attending: Specialist | Admitting: Specialist

## 2018-02-01 ENCOUNTER — Other Ambulatory Visit: Payer: Self-pay | Admitting: Specialist

## 2018-02-01 ENCOUNTER — Ambulatory Visit
Admission: RE | Admit: 2018-02-01 | Discharge: 2018-02-01 | Disposition: A | Discharge status: Home / Self Care | Payer: 59 | Source: Ambulatory Visit | Attending: Specialist | Admitting: Specialist

## 2018-02-01 DIAGNOSIS — J439 Emphysema, unspecified: Secondary | ICD-10-CM | POA: Insufficient documentation

## 2018-02-01 DIAGNOSIS — R7989 Other specified abnormal findings of blood chemistry: Secondary | ICD-10-CM | POA: Insufficient documentation

## 2018-02-01 DIAGNOSIS — R0789 Other chest pain: Secondary | ICD-10-CM | POA: Insufficient documentation

## 2018-02-01 LAB — POCT I-STAT CREATININE: Creatinine, Ser: 1 mg/dL (ref 0.44–1.00)

## 2018-02-01 LAB — FIBRIN DERIVATIVES D-DIMER (ARMC ONLY): Fibrin derivatives D-dimer (ARMC): 618.64 ng/mL (FEU) — ABNORMAL HIGH (ref 0.00–499.00)

## 2018-02-01 MED ORDER — IOPAMIDOL (ISOVUE-370) INJECTION 76%
75.0000 mL | Freq: Once | INTRAVENOUS | Status: AC | PRN
  Administered 2018-02-01: 75 mL via INTRAVENOUS

## 2018-04-05 ENCOUNTER — Other Ambulatory Visit: Payer: Self-pay | Admitting: Family Medicine

## 2018-04-05 DIAGNOSIS — Z780 Asymptomatic menopausal state: Secondary | ICD-10-CM

## 2018-04-05 DIAGNOSIS — Z139 Encounter for screening, unspecified: Secondary | ICD-10-CM

## 2018-08-08 ENCOUNTER — Ambulatory Visit
Admission: RE | Admit: 2018-08-08 | Discharge: 2018-08-08 | Disposition: A | Discharge status: Home / Self Care | Payer: 59 | Source: Ambulatory Visit | Attending: Family Medicine | Admitting: Family Medicine

## 2018-08-08 DIAGNOSIS — Z780 Asymptomatic menopausal state: Secondary | ICD-10-CM

## 2018-08-08 DIAGNOSIS — Z139 Encounter for screening, unspecified: Secondary | ICD-10-CM

## 2019-06-10 ENCOUNTER — Other Ambulatory Visit: Payer: Self-pay

## 2019-06-10 ENCOUNTER — Encounter: Payer: Self-pay | Admitting: Emergency Medicine

## 2019-06-10 ENCOUNTER — Inpatient Hospital Stay
Admission: EM | Admit: 2019-06-10 | Discharge: 2019-06-12 | DRG: 392 | Disposition: A | Discharge status: Home / Self Care | Payer: PRIVATE HEALTH INSURANCE | Attending: Internal Medicine | Admitting: Internal Medicine

## 2019-06-10 ENCOUNTER — Emergency Department: Payer: PRIVATE HEALTH INSURANCE

## 2019-06-10 DIAGNOSIS — Z1159 Encounter for screening for other viral diseases: Secondary | ICD-10-CM | POA: Diagnosis not present

## 2019-06-10 DIAGNOSIS — R109 Unspecified abdominal pain: Secondary | ICD-10-CM

## 2019-06-10 DIAGNOSIS — K529 Noninfective gastroenteritis and colitis, unspecified: Principal | ICD-10-CM

## 2019-06-10 DIAGNOSIS — M810 Age-related osteoporosis without current pathological fracture: Secondary | ICD-10-CM | POA: Diagnosis present

## 2019-06-10 DIAGNOSIS — E871 Hypo-osmolality and hyponatremia: Secondary | ICD-10-CM | POA: Diagnosis present

## 2019-06-10 DIAGNOSIS — K760 Fatty (change of) liver, not elsewhere classified: Secondary | ICD-10-CM | POA: Diagnosis present

## 2019-06-10 DIAGNOSIS — K559 Vascular disorder of intestine, unspecified: Secondary | ICD-10-CM | POA: Diagnosis present

## 2019-06-10 DIAGNOSIS — F17210 Nicotine dependence, cigarettes, uncomplicated: Secondary | ICD-10-CM | POA: Diagnosis present

## 2019-06-10 DIAGNOSIS — K219 Gastro-esophageal reflux disease without esophagitis: Secondary | ICD-10-CM | POA: Diagnosis present

## 2019-06-10 DIAGNOSIS — J449 Chronic obstructive pulmonary disease, unspecified: Secondary | ICD-10-CM | POA: Diagnosis present

## 2019-06-10 DIAGNOSIS — Z86718 Personal history of other venous thrombosis and embolism: Secondary | ICD-10-CM | POA: Diagnosis not present

## 2019-06-10 DIAGNOSIS — Z803 Family history of malignant neoplasm of breast: Secondary | ICD-10-CM

## 2019-06-10 DIAGNOSIS — E785 Hyperlipidemia, unspecified: Secondary | ICD-10-CM | POA: Diagnosis present

## 2019-06-10 DIAGNOSIS — E878 Other disorders of electrolyte and fluid balance, not elsewhere classified: Secondary | ICD-10-CM | POA: Diagnosis present

## 2019-06-10 DIAGNOSIS — B370 Candidal stomatitis: Secondary | ICD-10-CM | POA: Diagnosis present

## 2019-06-10 LAB — CBC
HCT: 43.7 % (ref 36.0–46.0)
Hemoglobin: 14.6 g/dL (ref 12.0–15.0)
MCH: 29.1 pg (ref 26.0–34.0)
MCHC: 33.4 g/dL (ref 30.0–36.0)
MCV: 87.2 fL (ref 80.0–100.0)
Platelets: 287 10*3/uL (ref 150–400)
RBC: 5.01 MIL/uL (ref 3.87–5.11)
RDW: 11.8 % (ref 11.5–15.5)
WBC: 11.9 10*3/uL — ABNORMAL HIGH (ref 4.0–10.5)
nRBC: 0 % (ref 0.0–0.2)

## 2019-06-10 LAB — URINALYSIS, COMPLETE (UACMP) WITH MICROSCOPIC
Bacteria, UA: NONE SEEN
Bilirubin Urine: NEGATIVE
Glucose, UA: NEGATIVE mg/dL
Hgb urine dipstick: NEGATIVE
Ketones, ur: NEGATIVE mg/dL
Leukocytes,Ua: NEGATIVE
Nitrite: NEGATIVE
Protein, ur: NEGATIVE mg/dL
Specific Gravity, Urine: 1.003 — ABNORMAL LOW (ref 1.005–1.030)
pH: 7 (ref 5.0–8.0)

## 2019-06-10 LAB — CBC WITH DIFFERENTIAL/PLATELET
Abs Immature Granulocytes: 0.07 10*3/uL (ref 0.00–0.07)
Basophils Absolute: 0 10*3/uL (ref 0.0–0.1)
Basophils Relative: 0 %
Eosinophils Absolute: 0 10*3/uL (ref 0.0–0.5)
Eosinophils Relative: 0 %
HCT: 44.2 % (ref 36.0–46.0)
Hemoglobin: 14.7 g/dL (ref 12.0–15.0)
Immature Granulocytes: 1 %
Lymphocytes Relative: 15 %
Lymphs Abs: 1.7 10*3/uL (ref 0.7–4.0)
MCH: 29.2 pg (ref 26.0–34.0)
MCHC: 33.3 g/dL (ref 30.0–36.0)
MCV: 87.9 fL (ref 80.0–100.0)
Monocytes Absolute: 0.5 10*3/uL (ref 0.1–1.0)
Monocytes Relative: 4 %
Neutro Abs: 9.2 10*3/uL — ABNORMAL HIGH (ref 1.7–7.7)
Neutrophils Relative %: 80 %
Platelets: 310 10*3/uL (ref 150–400)
RBC: 5.03 MIL/uL (ref 3.87–5.11)
RDW: 11.9 % (ref 11.5–15.5)
WBC: 11.4 10*3/uL — ABNORMAL HIGH (ref 4.0–10.5)
nRBC: 0 % (ref 0.0–0.2)

## 2019-06-10 LAB — COMPREHENSIVE METABOLIC PANEL
ALT: 18 U/L (ref 0–44)
AST: 22 U/L (ref 15–41)
Albumin: 4.2 g/dL (ref 3.5–5.0)
Alkaline Phosphatase: 92 U/L (ref 38–126)
Anion gap: 10 (ref 5–15)
BUN: 10 mg/dL (ref 8–23)
CO2: 27 mmol/L (ref 22–32)
Calcium: 8.7 mg/dL — ABNORMAL LOW (ref 8.9–10.3)
Chloride: 95 mmol/L — ABNORMAL LOW (ref 98–111)
Creatinine, Ser: 0.78 mg/dL (ref 0.44–1.00)
GFR calc Af Amer: >60 mL/min (ref >60)
GFR calc non Af Amer: >60 mL/min (ref >60)
Glucose, Bld: 112 mg/dL — ABNORMAL HIGH (ref 70–99)
Potassium: 4.3 mmol/L (ref 3.5–5.1)
Sodium: 132 mmol/L — ABNORMAL LOW (ref 135–145)
Total Bilirubin: 0.5 mg/dL (ref 0.3–1.2)
Total Protein: 6.7 g/dL (ref 6.5–8.1)

## 2019-06-10 LAB — LIPASE, BLOOD: Lipase: 34 U/L (ref 11–51)

## 2019-06-10 MED ORDER — SODIUM CHLORIDE 0.9 % IV BOLUS
1000.0000 mL | Freq: Once | INTRAVENOUS | Status: AC
  Administered 2019-06-10: 21:00:00 1000 mL via INTRAVENOUS

## 2019-06-10 MED ORDER — ACETAMINOPHEN 500 MG PO TABS
1000.0000 mg | ORAL_TABLET | Freq: Once | ORAL | Status: AC
  Administered 2019-06-10: 1000 mg via ORAL

## 2019-06-10 MED ORDER — IOHEXOL 240 MG/ML SOLN
50.0000 mL | Freq: Once | INTRAMUSCULAR | Status: AC | PRN
  Administered 2019-06-10: 21:00:00 50 mL via ORAL

## 2019-06-10 MED ORDER — ACETAMINOPHEN 500 MG PO TABS
ORAL_TABLET | ORAL | Status: AC
  Administered 2019-06-10: 1000 mg via ORAL
  Filled 2019-06-10: qty 2

## 2019-06-10 MED ORDER — MORPHINE SULFATE (PF) 4 MG/ML IV SOLN
4.0000 mg | Freq: Once | INTRAVENOUS | Status: DC
  Filled 2019-06-10: qty 1

## 2019-06-10 MED ORDER — IOHEXOL 300 MG/ML  SOLN
100.0000 mL | Freq: Once | INTRAMUSCULAR | Status: AC | PRN
  Administered 2019-06-10: 21:00:00 100 mL via INTRAVENOUS

## 2019-06-10 MED ORDER — ONDANSETRON HCL 4 MG/2ML IJ SOLN
4.0000 mg | Freq: Once | INTRAMUSCULAR | Status: AC
  Administered 2019-06-10: 4 mg via INTRAVENOUS
  Filled 2019-06-10: qty 2

## 2019-06-10 NOTE — ED Triage Notes (Signed)
Patient presents to the ED with left lower quadrant abdominal pain.  Patient reports diarrhea and nausea on Friday.  Patient states she took zofran at that time and then did not have any bowel movements Saturday or Sunday.  Patient states today, patient broke out in a sweat and felt nauseous with a very painful bowel movement.  Patient is alert and oriented x 4.  Skin is warm and dry.  Patient appears slightly pale.

## 2019-06-10 NOTE — ED Notes (Signed)
Pt up to toilet 

## 2019-06-10 NOTE — ED Notes (Signed)
Patient transported to CT 

## 2019-06-10 NOTE — ED Provider Notes (Signed)
Clinical Course as of Jun 09 2316  Mon Jun 10, 2019  2254 Dr. Vicente Males advises may represent infectious colitis or ischemic colitis. Recommends inpatient obs and GI consult for serial exam. Obtain stool cultures. Dr. Vicente Males recommends admission.    [MQ]    Clinical Course User Index [MQ] Delman Kitten, MD     ----------------------------------------- 11:17 PM on 06/10/2019 -----------------------------------------  Patient has stooled twice.  One still had some next blood in it.  Second stool greenish-brown rather loose.  She is resting comfortably, reports ongoing pain but does not wish for morphine she like to try Tylenol first.  We will continue to hydrate, nausea improved.  Patient understanding and agreeable with plan for admission as we work-up etiology and cause of colitis.  She is hemodynamically stable.  Appears appropriate for admission, ongoing IV hydration.  Case discussed with nurse practitioner Leotis Pain, MD 06/10/19 [id number]

## 2019-06-10 NOTE — ED Provider Notes (Signed)
[facility] Emergency Department Provider Note   ____________________________________________   First MD Initiated Contact with Patient 06/10/19 2024     (approximate)  I have reviewed the triage vital signs and the nursing notes.   HISTORY  Chief Complaint Abdominal Pain    HPI Natasha [name] is a 63 y.o. female who reports that she began having problems 3 weeks ago after she got doxycycline for a tick bite.  She said she got sensation like she had a UTI went to the doctor they did not see anything.  She then went back a couple days later when they thought she had a UTI and was put on Bactrim.  After 4 days she began feeling worse and felt sick and weak.  Now she is having abdominal pain predominantly on the left side with diarrhea Thursday and Friday none no stool at all on Saturday and Sunday and some mucousy bloody diarrhea today.  She only has diarrhea in the mornings generally.  She has been feeling weak.  She really has not been eating much for the last week approximately.  She says crackers make her mouth burn.  She got Diflucan from her doctor for that.  She says she feels dehydrated.      Past Medical History:  Diagnosis Date  . Acid reflux   . GERD (gastroesophageal reflux disease)   . Hyperlipidemia   . Osteoporosis   . Thrombophlebitis 1983  . Unspecified venous (peripheral) insufficiency     There are no active problems to display for this patient.   Past Surgical History:  Procedure Laterality Date  . CHOLECYSTECTOMY  2007  . COLONOSCOPY  2008  . LAPAROSCOPY  2007    Prior to Admission medications   Medication Sig Start Date End Date Taking? Authorizing Provider  albuterol (PROVENTIL HFA;VENTOLIN HFA) 108 (90 Base) MCG/ACT inhaler Inhale into the lungs every 6 (six) hours as needed for wheezing or shortness of breath.    [provider]  Calcium Citrate-Vitamin D (CALCIUM + D PO) Take by mouth.    [provider]  ciprofloxacin (CIPRO) 500 MG tablet Take 1 tablet (500 mg total) by mouth 2 (two) times daily. 02/21/16   Delman Kitten, MD  metroNIDAZOLE (FLAGYL) 500 MG tablet Take 1 tablet (500 mg total) by mouth 3 (three) times daily. 02/21/16   Delman Kitten, MD  mometasone (NASONEX) 50 MCG/ACT nasal spray Place 2 sprays into the nose daily.    [provider]  ondansetron (ZOFRAN-ODT) 8 MG disintegrating tablet Take 1 tablet (8 mg total) by mouth every 8 (eight) hours as needed for nausea or vomiting. 03/01/16   Gregor Hams, MD  oxyCODONE-acetaminophen (PERCOCET/ROXICET) 5-325 MG tablet Take 1 tablet by mouth every 4 (four) hours as needed for severe pain. 03/01/16   Gregor Hams, MD  RaNITidine HCl (ZANTAC PO) Take by mouth.    [provider]    Allergies Codeine and Hydrocodone-acetaminophen  Family History  Problem Relation Age of Onset  . Breast cancer Mother 41    Social History Social History   Tobacco Use  . Smoking status: Current Every Day Smoker    Packs/day: 1.00    Years: 30.00    Pack years: 30.00  . Smokeless tobacco: Never Used  Substance Use Topics  . Alcohol use: Yes  . Drug use: No    Review of Systems  Constitutional: No fever/chills Eyes: No visual changes. ENT: No sore throat. Cardiovascular: Denies chest  pain. Respiratory: Denies shortness of breath. Gastrointestinal: See HPI Genitourinary: See HPI Musculoskeletal: Negative for back pain. Skin: Negative for rash. Neurological: Negative for headaches, focal weakness  ____________________________________________   PHYSICAL EXAM:  VITAL SIGNS: ED Triage Vitals  Enc Vitals Group     BP 06/10/19 1629 117/69     Pulse Rate 06/10/19 1629 82     Resp 06/10/19 1629 16     Temp 06/10/19 1629 98.9 F (37.2 C)     Temp Source 06/10/19 1629 Oral     SpO2 06/10/19 1629 96 %     Weight 06/10/19 1637 138 lb (62.6 kg)     Height 06/10/19 1637 5\' 2"  (1.575 m)     Head Circumference --       Peak Flow --      Pain Score 06/10/19 1637 3     Pain Loc --      Pain Edu? --      Excl. in [place]? --     Constitutional: Alert and oriented. Well appearing and in no acute distress. Eyes: Conjunctivae are normal. Head: Atraumatic. Nose: No congestion/rhinnorhea. Mouth/Throat: Mucous membranes are moist.  Oropharynx non-erythematous.  Tongue is coated but otherwise mouth looks normal Neck: No stridor. Cardiovascular: Normal rate, regular rhythm. Grossly normal heart sounds.  Good peripheral circulation. Respiratory: Normal respiratory effort.  No retractions. Lungs CTAB. Gastrointestinal: Soft patient complains of pain on palpation percussion especially on the left side. No distention. No abdominal bruits.  Musculoskeletal: No lower extremity tenderness nor edema.  No joint effusions. Neurologic:  Normal speech and language. No gross focal neurologic deficits are appreciated.  Skin:  Skin is warm, dry and intact. No rash noted.   ____________________________________________   LABS (all labs ordered are listed, but only abnormal results are displayed)  Labs Reviewed  COMPREHENSIVE METABOLIC PANEL - Abnormal; Notable for the following components:      Result Value   Sodium 132 (*)    Chloride 95 (*)    Glucose, Bld 112 (*)    Calcium 8.7 (*)    All other components within normal limits  CBC - Abnormal; Notable for the following components:   WBC 11.9 (*)    All other components within normal limits  URINALYSIS, COMPLETE (UACMP) WITH MICROSCOPIC - Abnormal; Notable for the following components:   Color, Urine STRAW (*)    APPearance CLEAR (*)    Specific Gravity, Urine 1.003 (*)    All other components within normal limits  LIPASE, BLOOD  CBC WITH DIFFERENTIAL/PLATELET   ____________________________________________  EKG  EKG read interpreted by me shows normal sinus rhythm rate of 83 rightward axis was very slight rightward axis no acute ST-T changes  ____________________________________________  RADIOLOGY  ED MD interpretation:    Official radiology report(s): No results found.  ____________________________________________   PROCEDURES  Procedure(s) performed (including Critical Care):  Procedures   ____________________________________________   INITIAL IMPRESSION / ASSESSMENT AND PLAN / ED COURSE  We will try to get a stool specimen from this patient and CT fluids and pain meds I will sign her out to Dr. Georgiana Spinner              ____________________________________________   FINAL CLINICAL IMPRESSION(S) / ED DIAGNOSES  Final diagnoses:  Abdominal pain, unspecified abdominal location     ED Discharge Orders    None       Note:  This document was prepared using Dragon voice recognition software and may include unintentional dictation errors.  Nena Polio, MD 06/10/19 2034

## 2019-06-11 DIAGNOSIS — K559 Vascular disorder of intestine, unspecified: Secondary | ICD-10-CM

## 2019-06-11 LAB — GASTROINTESTINAL PANEL BY PCR, STOOL (REPLACES STOOL CULTURE)

## 2019-06-11 LAB — CBC
HCT: 38.3 % (ref 36.0–46.0)
Hemoglobin: 13 g/dL (ref 12.0–15.0)
MCH: 29.5 pg (ref 26.0–34.0)
MCHC: 33.9 g/dL (ref 30.0–36.0)
MCV: 87 fL (ref 80.0–100.0)
Platelets: 235 10*3/uL (ref 150–400)
RBC: 4.4 MIL/uL (ref 3.87–5.11)
RDW: 11.7 % (ref 11.5–15.5)
WBC: 6.8 10*3/uL (ref 4.0–10.5)
nRBC: 0 % (ref 0.0–0.2)

## 2019-06-11 LAB — OCCULT BLOOD X 1 CARD TO LAB, STOOL: Fecal Occult Bld: POSITIVE — AB

## 2019-06-11 LAB — BASIC METABOLIC PANEL
Anion gap: 8 (ref 5–15)
BUN: 7 mg/dL — ABNORMAL LOW (ref 8–23)
CO2: 25 mmol/L (ref 22–32)
Calcium: 8 mg/dL — ABNORMAL LOW (ref 8.9–10.3)
Chloride: 103 mmol/L (ref 98–111)
Creatinine, Ser: 0.72 mg/dL (ref 0.44–1.00)
GFR calc Af Amer: >60 mL/min (ref >60)
GFR calc non Af Amer: >60 mL/min (ref >60)
Glucose, Bld: 104 mg/dL — ABNORMAL HIGH (ref 70–99)
Potassium: 4.5 mmol/L (ref 3.5–5.1)
Sodium: 136 mmol/L (ref 135–145)

## 2019-06-11 LAB — C DIFFICILE QUICK SCREEN W PCR REFLEX
C Diff antigen: NEGATIVE
C Diff interpretation: NOT DETECTED
C Diff toxin: NEGATIVE

## 2019-06-11 LAB — HEMOGLOBIN AND HEMATOCRIT, BLOOD
HCT: 40.4 % (ref 36.0–46.0)
HCT: 42.9 % (ref 36.0–46.0)
HCT: 40.8 % (ref 36.0–46.0)
Hemoglobin: 13.4 g/dL (ref 12.0–15.0)
Hemoglobin: 14.2 g/dL (ref 12.0–15.0)
Hemoglobin: 13.3 g/dL (ref 12.0–15.0)

## 2019-06-11 LAB — LACTOFERRIN, FECAL, QUALITATIVE: Lactoferrin, Fecal, Qual: POSITIVE — AB

## 2019-06-11 MED ORDER — METRONIDAZOLE IN NACL 5-0.79 MG/ML-% IV SOLN
500.0000 mg | Freq: Three times a day (TID) | INTRAVENOUS | Status: DC
  Administered 2019-06-11 – 2019-06-12 (×5): 500 mg via INTRAVENOUS
  Filled 2019-06-11 (×7): qty 100

## 2019-06-11 MED ORDER — SODIUM CHLORIDE 0.9 % IV SOLN
INTRAVENOUS | Status: DC
  Administered 2019-06-11 (×2): via INTRAVENOUS

## 2019-06-11 MED ORDER — NICOTINE 14 MG/24HR TD PT24
14.0000 mg | MEDICATED_PATCH | Freq: Every day | TRANSDERMAL | Status: DC
  Administered 2019-06-11 – 2019-06-12 (×2): 14 mg via TRANSDERMAL
  Filled 2019-06-11 (×2): qty 1

## 2019-06-11 MED ORDER — TRAZODONE HCL 50 MG PO TABS: 25.0000 mg | ORAL_TABLET | Freq: Every evening | ORAL | Status: DC | PRN

## 2019-06-11 MED ORDER — ONDANSETRON HCL 4 MG PO TABS: 4.0000 mg | ORAL_TABLET | Freq: Four times a day (QID) | ORAL | Status: DC | PRN

## 2019-06-11 MED ORDER — MAGNESIUM HYDROXIDE 400 MG/5ML PO SUSP: 30.0000 mL | Freq: Every day | ORAL | Status: DC | PRN

## 2019-06-11 MED ORDER — NYSTATIN 100000 UNIT/ML MT SUSP
5.0000 mL | Freq: Four times a day (QID) | OROMUCOSAL | Status: DC
  Administered 2019-06-11 – 2019-06-12 (×5): 500000 [IU] via ORAL
  Filled 2019-06-11 (×5): qty 5

## 2019-06-11 MED ORDER — ALBUTEROL SULFATE (2.5 MG/3ML) 0.083% IN NEBU: 3.0000 mL | INHALATION_SOLUTION | Freq: Four times a day (QID) | RESPIRATORY_TRACT | Status: DC | PRN

## 2019-06-11 MED ORDER — ACETAMINOPHEN 650 MG RE SUPP: 650.0000 mg | Freq: Four times a day (QID) | RECTAL | Status: DC | PRN

## 2019-06-11 MED ORDER — ACETAMINOPHEN 325 MG PO TABS
650.0000 mg | ORAL_TABLET | Freq: Four times a day (QID) | ORAL | Status: DC | PRN
  Administered 2019-06-11 – 2019-06-12 (×3): 650 mg via ORAL
  Filled 2019-06-11 (×3): qty 2

## 2019-06-11 MED ORDER — FAMOTIDINE 40 MG/5ML PO SUSR
20.0000 mg | Freq: Two times a day (BID) | ORAL | Status: DC
  Administered 2019-06-11: 20 mg via ORAL
  Filled 2019-06-11 (×2): qty 2.5

## 2019-06-11 MED ORDER — ONDANSETRON HCL 4 MG/2ML IJ SOLN: 4.0000 mg | Freq: Four times a day (QID) | INTRAMUSCULAR | Status: DC | PRN

## 2019-06-11 MED ORDER — SODIUM CHLORIDE 0.9 % IV SOLN
1.0000 g | INTRAVENOUS | Status: DC
  Administered 2019-06-12: 1 g via INTRAVENOUS
  Filled 2019-06-11: qty 1

## 2019-06-11 MED ORDER — SODIUM CHLORIDE 0.9 % IV SOLN
1.0000 g | INTRAVENOUS | Status: DC
  Administered 2019-06-11: 05:00:00 1 g via INTRAVENOUS
  Filled 2019-06-11: qty 1

## 2019-06-11 MED ORDER — CIPROFLOXACIN IN D5W 400 MG/200ML IV SOLN
400.0000 mg | Freq: Two times a day (BID) | INTRAVENOUS | Status: DC
  Filled 2019-06-11 (×2): qty 200

## 2019-06-11 MED ORDER — CIPROFLOXACIN IN D5W 400 MG/200ML IV SOLN: 400.0000 mg | Freq: Two times a day (BID) | INTRAVENOUS | Status: DC

## 2019-06-11 MED ORDER — CALCIUM CARBONATE-VITAMIN D 500-200 MG-UNIT PO TABS
ORAL_TABLET | Freq: Every day | ORAL | Status: DC
  Administered 2019-06-11 – 2019-06-12 (×2): 1 via ORAL
  Filled 2019-06-11 (×2): qty 1

## 2019-06-11 MED ORDER — FLUTICASONE PROPIONATE 50 MCG/ACT NA SUSP
2.0000 | Freq: Every day | NASAL | Status: DC
  Filled 2019-06-11: qty 16

## 2019-06-11 NOTE — Plan of Care (Addendum)
The patient had 6 loose stools. Clear yellow. IV antibiotics provided. Nystatin oral provided for thrush. Patient independent at home.   Problem: Education: Goal: Knowledge of General Education information will improve Description: Including pain rating scale, medication(s)/side effects and non-pharmacologic comfort measures Outcome: Progressing   Problem: Health Behavior/Discharge Planning: Goal: Ability to manage health-related needs will improve Outcome: Progressing   Problem: Clinical Measurements: Goal: Ability to maintain clinical measurements within normal limits will improve Outcome: Progressing Goal: Will remain free from infection Outcome: Progressing Goal: Diagnostic test results will improve Outcome: Progressing Goal: Respiratory complications will improve Outcome: Progressing Goal: Cardiovascular complication will be avoided Outcome: Progressing   Problem: Activity: Goal: Risk for activity intolerance will decrease Outcome: Progressing   Problem: Nutrition: Goal: Adequate nutrition will be maintained Outcome: Progressing   Problem: Coping: Goal: Level of anxiety will decrease Outcome: Progressing   Problem: Elimination: Goal: Will not experience complications related to bowel motility Outcome: Progressing Goal: Will not experience complications related to urinary retention Outcome: Progressing   Problem: Pain Managment: Goal: General experience of comfort will improve Outcome: Progressing   Problem: Safety: Goal: Ability to remain free from injury will improve Outcome: Progressing   Problem: Skin Integrity: Goal: Risk for impaired skin integrity will decrease Outcome: Progressing

## 2019-06-11 NOTE — Progress Notes (Addendum)
[place] [facility] [place] NAME: Natasha [name]    MR#:  [id number]  DATE OF BIRTH:  1956/02/03  SUBJECTIVE:  patient came in with left lower quadrant abdominal pain and some diarrhea with streaks of blood. Denies any fever.  REVIEW OF SYSTEMS:   Review of Systems  Constitutional: Negative for chills, fever and weight loss.  HENT: Negative for ear discharge, ear pain and nosebleeds.   Eyes: Negative for blurred vision, pain and discharge.  Respiratory: Negative for sputum production, shortness of breath, wheezing and stridor.   Cardiovascular: Negative for chest pain, palpitations, orthopnea and PND.  Gastrointestinal: Positive for abdominal pain, diarrhea and nausea. Negative for vomiting.  Genitourinary: Negative for frequency and urgency.  Musculoskeletal: Negative for back pain and joint pain.  Neurological: Positive for weakness. Negative for sensory change, speech change and focal weakness.  Psychiatric/Behavioral: Negative for depression and hallucinations. The patient is not nervous/anxious.    Tolerating Diet:clears Tolerating PT: not needed  DRUG ALLERGIES:   Allergies  Allergen Reactions  . Codeine Rash and Hives  . Hydrocodone-Acetaminophen Nausea Only    VITALS:  Blood pressure 106/69, pulse 61, temperature 98.4 F (36.9 C), temperature source Oral, resp. rate 18, height 5\' 2"  (1.575 m), weight 62.6 kg, SpO2 95 %.  PHYSICAL EXAMINATION:   Physical Exam  GENERAL:  63 y.o.-year-old patient lying in the bed with no acute distress.  EYES: Pupils equal, round, reactive to light and accommodation. No scleral icterus. Extraocular muscles intact.  HEENT: Head atraumatic, normocephalic. Oropharynx - few spots of thrush  NECK:  Supple, no jugular venous distention. No thyroid enlargement, no tenderness.  LUNGS: Normal breath sounds bilaterally, no wheezing, rales, rhonchi. No use of accessory muscles of respiration.   CARDIOVASCULAR: S1, S2 normal. No murmurs, rubs, or gallops.  ABDOMEN: Soft, mild tenderness left lower quadrant, nondistended. Bowel sounds present. No organomegaly or mass.  EXTREMITIES: No cyanosis, clubbing or edema b/l.    NEUROLOGIC: Cranial nerves II through XII are intact. No focal Motor or sensory deficits b/l.   PSYCHIATRIC:  patient is alert and oriented x 3.  SKIN: No obvious rash, lesion, or ulcer.   LABORATORY PANEL:  CBC Recent Labs  Lab 06/11/19 0253 06/11/19 0716  WBC 6.8  --   HGB 13.0 13.4  HCT 38.3 40.4  PLT 235  --     Chemistries  Recent Labs  Lab 06/10/19 1642 06/11/19 0253  NA 132* 136  K 4.3 4.5  CL 95* 103  CO2 27 25  GLUCOSE 112* 104*  BUN 10 7*  CREATININE 0.78 0.72  CALCIUM 8.7* 8.0*  AST 22  --   ALT 18  --   ALKPHOS 92  --   BILITOT 0.5  --    Cardiac Enzymes No results for input(s): TROPONINI in the last 168 hours. RADIOLOGY:  Ct Abdomen Pelvis W Contrast  Result Date: 06/10/2019 CLINICAL DATA:  Left lower quadrant abdominal pain. EXAM: CT ABDOMEN AND PELVIS WITH CONTRAST TECHNIQUE: Multidetector CT imaging of the abdomen and pelvis was performed using the standard protocol following bolus administration of intravenous contrast. CONTRAST:  125mL OMNIPAQUE IOHEXOL 300 MG/ML  SOLN COMPARISON:  02/21/2016 FINDINGS: Lower chest: Changes of COPD. Hepatobiliary: Mild hepatic steatosis. Postcholecystectomy. No evidence of biliary ductal dilation. Pancreas: Unremarkable. No pancreatic ductal dilatation or surrounding inflammatory changes. Spleen: Normal in size without focal abnormality. Adrenals/Urinary Tract: Adrenal glands are unremarkable. Kidneys are normal, without renal calculi, focal lesion, or  hydronephrosis. Bladder is unremarkable. Stomach/Bowel: Stomach is within normal limits. Appendix appears normal. No evidence of small bowel wall thickening, distention, or inflammatory changes. Mild diffuse mucosal thickening of the distal transverse,  proximal descending and parts of the sigmoid colon. Scattered left colonic diverticula. Vascular/Lymphatic: Aortic atherosclerosis. No enlarged abdominal or pelvic lymph nodes. Reproductive: Uterus and bilateral adnexa are unremarkable. Other: No abdominal wall hernia or abnormality. No abdominopelvic ascites. Musculoskeletal: No acute or significant osseous findings. IMPRESSION: 1. Mild hepatic steatosis. 2. Mild inflammatory changes of the distal transverse, proximal descending and parts of the sigmoid colon. This may represent infectious/inflammatory or ischemic colitis. Scattered colonic diverticulosis. 3. Changes of COPD. Electronically Signed   By: Fidela Salisbury M.D.   On: 06/10/2019 21:20   ASSESSMENT AND PLAN:  Natasha [name]  is a 64 y.o. pleasant occasion female with a known history of COPD, tobacco abuse, dyslipidemia, C. difficile and herpes zoster as well as left lower extremity DVT, who presented to the emergency room with acute onset of left lower quadrant abdominal pain which has been intermittent over the last 2 weeks.  She started having diarrhea this morning and noticed bright red blood with her bowel movements this evening  1.  Acute colitis  infectious vs ischemic colitis given associated GI bleeding - - on hydration with IV normal saline.  - on IV Flagyl and Cipro -c difficile negative.  -GI PCR negative - stool lactoferrin positive -GI consultation will be obtained by Dr.Wohl  -hgb stable  2.  oral thrush -nystatin S and S--pt has recent abxs  3.  COPD -  No current exacerbation.   -She will be placed on her PRN albuterol MDI.  4.  Tobacco abuse  -She has a 14 mg NicoDerm CQ patch that will be continued.  5.  Dyslipidemia. - diet managed.  6.  DVT prophylaxis.  -  on SCDs.    Management plans discussed with the patient.  CODE STATUS: full  DVT Prophylaxis: SCD  TOTAL TIME TAKING CARE OF THIS PATIENT: *30* minutes.  >50% time spent on counselling  and coordination of care  POSSIBLE D/C IN *1-2* DAYS, DEPENDING ON CLINICAL CONDITION.  Note: This dictation was prepared with Dragon dictation along with smaller phrase technology. Any transcriptional errors that result from this process are unintentional.  Fritzi Mandes M.D on 06/11/2019 at 12:34 PM  Between 7am to 6pm - Pager - [phone]  After 6pm go to [email or web address] - Proofreader  [facility]  Office  [phone]  CC: Primary care physician; Hortencia Pilar, MDPatient ID: Natasha [name], female   DOB: 20-Aug-1956, 63 y.o.   MRN: [id number]

## 2019-06-11 NOTE — H&P (Signed)
[facility] NAME: Natasha [name]    MR#:  [id number]  DATE OF BIRTH:  Apr 15, 1956  DATE OF ADMISSION:  06/10/2019  PRIMARY CARE PHYSICIAN: Hortencia Pilar, MD   REQUESTING/REFERRING PHYSICIAN: Delman Kitten, MD CHIEF COMPLAINT:   Chief Complaint  Patient presents with  . Abdominal Pain    HISTORY OF PRESENT ILLNESS:  Natasha [name]  is a 63 y.o. pleasant occasion female with a known history of COPD, tobacco abuse, dyslipidemia, C. difficile and herpes zoster as well as left lower extremity DVT, who presented to the emergency room with acute onset of left lower quadrant abdominal pain which has been intermittent over the last 2 weeks.  She started having diarrhea this morning and noticed bright red blood with her bowel movements this evening.  She stated this morning her pain was significantly worse that she became diaphoretic and dizzy and almost vomited.  She took doxycycline for a tick bite around 6/11 for 10 days and before finishing she felt urinary symptoms concerning for UTI for which she took cranberry juice.  She was seen by her primary care physician and at that time her urinalysis came back negative.  2 days later her UA showed some findings that made her primary care doctor Dr. Hoy Morn give her p.o. Bactrim.  She took 6 tablets over 4 days and she then felt very sick with dry mouth and no appetite.  She had a negative urine culture.  Bactrim DS was stopped.  She was seen again by her primary care physician and her tongue was whitish and she was given p.o. Diflucan for 5 days.  She denies any cough or wheezing or shortness of breath.  She denied any recent sick exposures.  No chest pain or dyspnea or palpitations.  Upon presentation to the emergency room, vital signs were within normal.  Labs revealed mild hyponatremia 132 and hypochloremia of 95 and her serum lipase was 34.  CBC showed WBC of 11.9 and and repeat level was 11.4 with 80%  neutrophils and hemoglobin of 14.7 hematocrit of 44.2 with platelets of 310.  Urinalysis showed low specific gravity 1003 and otherwise unremarkable abdominal and pelvic CT scan revealed infectious versus inflammatory or ischemic colitis involving the distal transverse colon, proximal distal colon and parts of the sigmoid colon with scattered diverticulosis.  The patient had a stool for C. difficile ordered in the ER.  She was given 1 g of p.o. Tylenol, 4 mg of IV Zofran and 1 L bolus of IV normal saline.  She was fairly comfortable during my interview.  She will be admitted to a medical bed for further evaluation and management.   PAST MEDICAL HISTORY:   Past Medical History:  Diagnosis Date  . Acid reflux   . GERD (gastroesophageal reflux disease)   . Hyperlipidemia   . Osteoporosis   . Thrombophlebitis 1983  . Unspecified venous (peripheral) insufficiency   History of C. difficile colitis Herpes zoster COPD Tobacco abuse Left lower extremity DVT Lower extremity DVT Left lower extremity DVT left lower extremity DVTLeft lower extremity DVT PAST SURGICAL HISTORY:   Past Surgical History:  Procedure Laterality Date  . CHOLECYSTECTOMY  2007  . COLONOSCOPY  2008  . LAPAROSCOPY  2007    SOCIAL HISTORY:   Social History   Tobacco Use  . Smoking status: Current Every Day Smoker    Packs/day: 1.00    Years: 30.00    Pack years: 30.00  .  Smokeless tobacco: Never Used  Substance Use Topics  . Alcohol use: Yes    FAMILY HISTORY:   Family History  Problem Relation Age of Onset  . Breast cancer Mother 45    DRUG ALLERGIES:   Allergies  Allergen Reactions  . Codeine Rash and Hives  . Hydrocodone-Acetaminophen Nausea Only    REVIEW OF SYSTEMS:   ROS As per history of present illness. All pertinent systems were reviewed above. Constitutional,  HEENT, cardiovascular, respiratory, GI, GU, musculoskeletal, neuro, psychiatric, endocrine,  integumentary and hematologic  systems were reviewed and are otherwise  negative/unremarkable except for positive findings mentioned above in the HPI.   MEDICATIONS AT HOME:   Prior to Admission medications   Medication Sig Start Date End Date Taking? Authorizing Provider  albuterol (PROVENTIL HFA;VENTOLIN HFA) 108 (90 Base) MCG/ACT inhaler Inhale into the lungs every 6 (six) hours as needed for wheezing or shortness of breath.    [provider]  Calcium Citrate-Vitamin D (CALCIUM + D PO) Take 1 tablet by mouth daily.     [provider]  mometasone (NASONEX) 50 MCG/ACT nasal spray Place 2 sprays into the nose daily.    [provider]  RaNITidine HCl (ZANTAC PO) Take by mouth.    [provider]      VITAL SIGNS:  Blood pressure 110/70, pulse 67, temperature 98.9 F (37.2 C), temperature source Oral, resp. rate 18, height 5\' 2"  (1.575 m), weight 62.6 kg, SpO2 96 %.  PHYSICAL EXAMINATION:  Physical Exam  GENERAL:  63 y.o.-year-old pleasant Caucasian female patient lying in the bed with no acute distress.  EYES: Pupils equal, round, reactive to light and accommodation. No scleral icterus. Extraocular muscles intact.  HEENT: Head atraumatic, normocephalic. Oropharynx and nasopharynx clear.  NECK:  Supple, no jugular venous distention. No thyroid enlargement, no tenderness.  LUNGS: Normal breath sounds bilaterally, no wheezing, rales,rhonchi or crepitation. No use of accessory muscles of respiration.  CARDIOVASCULAR: Regular rate and rhythm, S1, S2 normal. No murmurs, rubs, or gallops.  ABDOMEN: Soft with mild left lower quadrant and suprapubic tenderness without rebound tenderness guarding or rigidity.  Bowel sounds present. No organomegaly or mass.  EXTREMITIES: No pedal edema, cyanosis, or clubbing.  NEUROLOGIC: Cranial nerves II through XII are intact. Muscle strength 5/5 in all extremities. Sensation intact. Gait not checked.  PSYCHIATRIC: The patient is alert and oriented x 3.   Normal affect and good eye contact. SKIN: No obvious rash, lesion, or ulcer.   LABORATORY PANEL:   CBC Recent Labs  Lab 06/10/19 1642  WBC 11.4*  11.9*  HGB 14.7  14.6  HCT 44.2  43.7  PLT 310  287   ------------------------------------------------------------------------------------------------------------------  Chemistries  Recent Labs  Lab 06/10/19 1642  NA 132*  K 4.3  CL 95*  CO2 27  GLUCOSE 112*  BUN 10  CREATININE 0.78  CALCIUM 8.7*  AST 22  ALT 18  ALKPHOS 92  BILITOT 0.5   ------------------------------------------------------------------------------------------------------------------  Cardiac Enzymes No results for input(s): TROPONINI in the last 168 hours. ------------------------------------------------------------------------------------------------------------------  RADIOLOGY:  Ct Abdomen Pelvis W Contrast  Result Date: 06/10/2019 CLINICAL DATA:  Left lower quadrant abdominal pain. EXAM: CT ABDOMEN AND PELVIS WITH CONTRAST TECHNIQUE: Multidetector CT imaging of the abdomen and pelvis was performed using the standard protocol following bolus administration of intravenous contrast. CONTRAST:  114mL OMNIPAQUE IOHEXOL 300 MG/ML  SOLN COMPARISON:  02/21/2016 FINDINGS: Lower chest: Changes of COPD. Hepatobiliary: Mild hepatic steatosis. Postcholecystectomy. No evidence of biliary ductal dilation.  Pancreas: Unremarkable. No pancreatic ductal dilatation or surrounding inflammatory changes. Spleen: Normal in size without focal abnormality. Adrenals/Urinary Tract: Adrenal glands are unremarkable. Kidneys are normal, without renal calculi, focal lesion, or hydronephrosis. Bladder is unremarkable. Stomach/Bowel: Stomach is within normal limits. Appendix appears normal. No evidence of small bowel wall thickening, distention, or inflammatory changes. Mild diffuse mucosal thickening of the distal transverse, proximal descending and parts of the sigmoid colon. Scattered  left colonic diverticula. Vascular/Lymphatic: Aortic atherosclerosis. No enlarged abdominal or pelvic lymph nodes. Reproductive: Uterus and bilateral adnexa are unremarkable. Other: No abdominal wall hernia or abnormality. No abdominopelvic ascites. Musculoskeletal: No acute or significant osseous findings. IMPRESSION: 1. Mild hepatic steatosis. 2. Mild inflammatory changes of the distal transverse, proximal descending and parts of the sigmoid colon. This may represent infectious/inflammatory or ischemic colitis. Scattered colonic diverticulosis. 3. Changes of COPD. Electronically Signed   By: Fidela Salisbury M.D.   On: 06/10/2019 21:20      IMPRESSION AND PLAN:   1.  Acute colitis with differential diagnosis including C. difficile colitis given recent antibiotic therapy as well as infectious colitis given associated GI bleeding ischemic colitis.  The patient will be admitted to a medical bed.  She will be placed on hydration with IV normal saline.  We will keep her n.p.o. for now.  Will place her on IV Flagyl and Cipro awaiting C. difficile results.  We will also obtain stool culture and sensitivity and WBC as well as stool Hemoccult.  A GI consultation will be obtained by Dr. Vicente Males was notified about the patient.  We will follow serial hemoglobin and hematocrits.  2.  GI bleeding.  Management as above.  This is likely of lower GI etiology possibly secondary to her colitis as well as diverticulosis.  3.  COPD.  No current exacerbation.  She will be placed on her PRN albuterol MDI.  4.  Tobacco abuse.  She has a 14 mg NicoDerm CQ patch that will be continued.  5.  Dyslipidemia.  This is apparently diet managed.  6.  DVT prophylaxis.  We will place her on SCDs.  Medical prophylaxis currently contraindicated due to her GI bleeding   All the records are reviewed and case discussed with ED provider. The plan of care was discussed in details with the patient (and family). I answered all  questions. The patient agreed to proceed with the above mentioned plan. Further management will depend upon hospital course.   CODE STATUS: Full code  TOTAL TIME TAKING CARE OF THIS PATIENT: 45 minutes.    Christel Mormon M.D on 06/11/2019 at 12:35 AM  Pager - [phone]  After 6pm go to [email or web address] - Proofreader  [facility]  Office  [phone]  CC: Primary care physician; Hortencia Pilar, MD   Note: This dictation was prepared with Dragon dictation along with smaller phrase technology. Any transcriptional errors that result from this process are unintentional.

## 2019-06-11 NOTE — ED Notes (Signed)
ED TO INPATIENT HANDOFF REPORT  ED Nurse Name and Phone #: Sharan Mcenaney 50  S Name/Age/Gender Natasha [name] 63 y.o. female Room/Bed: ED18A/ED18A  Code Status   Code Status: Not on file  Home/SNF/Other Home Patient oriented to: self, place, time and situation Is this baseline? Yes   Triage Complete: Triage complete  Chief Complaint Sent by MD/Abd Pain  Triage Note Patient presents to the ED with left lower quadrant abdominal pain.  Patient reports diarrhea and nausea on Friday.  Patient states she took zofran at that time and then did not have any bowel movements Saturday or Sunday.  Patient states today, patient broke out in a sweat and felt nauseous with a very painful bowel movement.  Patient is alert and oriented x 4.  Skin is warm and dry.  Patient appears slightly pale.     Allergies Allergies  Allergen Reactions  . Codeine Rash and Hives  . Hydrocodone-Acetaminophen Nausea Only    Level of Care/Admitting Diagnosis ED Disposition    ED Disposition Condition [facility]: [place] [[id number]]  Level of Care: Med-Surg [16]  Covid Evaluation: Asymptomatic Screening Protocol (No Symptoms)  Diagnosis: Acute colitis [703500]  Admitting Physician: Christel Mormon [[id number]]  Attending Physician: Christel Mormon [[id number]]  Estimated length of stay: past midnight tomorrow  Certification:: I certify this patient will need inpatient services for at least 2 midnights  PT Class (Do Not Modify): Inpatient [101]  PT Acc Code (Do Not Modify): Private [1]       B Medical/Surgery History Past Medical History:  Diagnosis Date  . Acid reflux   . GERD (gastroesophageal reflux disease)   . Hyperlipidemia   . Osteoporosis   . Thrombophlebitis 1983  . Unspecified venous (peripheral) insufficiency    Past Surgical History:  Procedure Laterality Date  . CHOLECYSTECTOMY  2007  . COLONOSCOPY  2008  . LAPAROSCOPY  2007     A IV  Location/Drains/Wounds Patient Lines/Drains/Airways Status   Active Line/Drains/Airways    Name:   Placement date:   Placement time:   Site:   Days:   Peripheral IV 06/10/19 Right Antecubital   06/10/19    2039    Antecubital   1          Intake/Output Last 24 hours  Intake/Output Summary (Last 24 hours) at 06/11/2019 0007 Last data filed at 06/10/2019 2303 Gross per 24 hour  Intake 1000 ml  Output -  Net 1000 ml    Labs/Imaging Results for orders placed or performed during the hospital encounter of 06/10/19 (from the past 48 hour(s))  Lipase, blood     Status: None   Collection Time: 06/10/19  4:42 PM  Result Value Ref Range   Lipase 34 11 - 51 U/L    Comment: Performed at [facility], [address] [place], [place] [address]  Comprehensive metabolic panel     Status: Abnormal   Collection Time: 06/10/19  4:42 PM  Result Value Ref Range   Sodium 132 (L) 135 - 145 mmol/L   Potassium 4.3 3.5 - 5.1 mmol/L   Chloride 95 (L) 98 - 111 mmol/L   CO2 27 22 - 32 mmol/L   Glucose, Bld 112 (H) 70 - 99 mg/dL   BUN 10 8 - 23 mg/dL   Creatinine, Ser 0.78 0.44 - 1.00 mg/dL   Calcium 8.7 (L) 8.9 - 10.3 mg/dL   Total Protein 6.7 6.5 - 8.1 g/dL  Albumin 4.2 3.5 - 5.0 g/dL   AST 22 15 - 41 U/L   ALT 18 0 - 44 U/L   Alkaline Phosphatase 92 38 - 126 U/L   Total Bilirubin 0.5 0.3 - 1.2 mg/dL   GFR calc non Af Amer >60 >60 mL/min   GFR calc Af Amer >60 >60 mL/min   Anion gap 10 5 - 15    Comment: Performed at [facility], [address] [place], [place] [address]  CBC     Status: Abnormal   Collection Time: 06/10/19  4:42 PM  Result Value Ref Range   WBC 11.9 (H) 4.0 - 10.5 K/uL   RBC 5.01 3.87 - 5.11 MIL/uL   Hemoglobin 14.6 12.0 - 15.0 g/dL   HCT 43.7 36.0 - 46.0 %   MCV 87.2 80.0 - 100.0 fL   MCH 29.1 26.0 - 34.0 pg   MCHC 33.4 30.0 - 36.0 g/dL   RDW 11.8 11.5 - 15.5 %   Platelets 287 150 - 400 K/uL   nRBC 0.0 0.0 - 0.2 %    Comment: Performed at  [facility], [address] [place], [place] [address]  CBC with Differential/Platelet     Status: Abnormal   Collection Time: 06/10/19  4:42 PM  Result Value Ref Range   WBC 11.4 (H) 4.0 - 10.5 K/uL   RBC 5.03 3.87 - 5.11 MIL/uL   Hemoglobin 14.7 12.0 - 15.0 g/dL   HCT 44.2 36.0 - 46.0 %   MCV 87.9 80.0 - 100.0 fL   MCH 29.2 26.0 - 34.0 pg   MCHC 33.3 30.0 - 36.0 g/dL   RDW 11.9 11.5 - 15.5 %   Platelets 310 150 - 400 K/uL   nRBC 0.0 0.0 - 0.2 %   Neutrophils Relative % 80 %   Neutro Abs 9.2 (H) 1.7 - 7.7 K/uL   Lymphocytes Relative 15 %   Lymphs Abs 1.7 0.7 - 4.0 K/uL   Monocytes Relative 4 %   Monocytes Absolute 0.5 0.1 - 1.0 K/uL   Eosinophils Relative 0 %   Eosinophils Absolute 0.0 0.0 - 0.5 K/uL   Basophils Relative 0 %   Basophils Absolute 0.0 0.0 - 0.1 K/uL   Immature Granulocytes 1 %   Abs Immature Granulocytes 0.07 0.00 - 0.07 K/uL    Comment: Performed at [facility], [address] [place], [place] [address]  Urinalysis, Complete w Microscopic     Status: Abnormal   Collection Time: 06/10/19  4:43 PM  Result Value Ref Range   Color, Urine STRAW (A) YELLOW   APPearance CLEAR (A) CLEAR   Specific Gravity, Urine 1.003 (L) 1.005 - 1.030   pH 7.0 5.0 - 8.0   Glucose, UA NEGATIVE NEGATIVE mg/dL   Hgb urine dipstick NEGATIVE NEGATIVE   Bilirubin Urine NEGATIVE NEGATIVE   Ketones, ur NEGATIVE NEGATIVE mg/dL   Protein, ur NEGATIVE NEGATIVE mg/dL   Nitrite NEGATIVE NEGATIVE   Leukocytes,Ua NEGATIVE NEGATIVE   WBC, UA 0-5 0 - 5 WBC/hpf   Bacteria, UA NONE SEEN NONE SEEN   Squamous Epithelial / LPF 0-5 0 - 5    Comment: Performed at [facility], [address] [place], [place] [address]   Ct Abdomen Pelvis W Contrast  Result Date: 06/10/2019 CLINICAL DATA:  Left lower quadrant abdominal pain. EXAM: CT ABDOMEN AND PELVIS WITH CONTRAST TECHNIQUE: Multidetector CT imaging of the abdomen and pelvis was performed using the standard  protocol following bolus  administration of intravenous contrast. CONTRAST:  178mL OMNIPAQUE IOHEXOL 300 MG/ML  SOLN COMPARISON:  02/21/2016 FINDINGS: Lower chest: Changes of COPD. Hepatobiliary: Mild hepatic steatosis. Postcholecystectomy. No evidence of biliary ductal dilation. Pancreas: Unremarkable. No pancreatic ductal dilatation or surrounding inflammatory changes. Spleen: Normal in size without focal abnormality. Adrenals/Urinary Tract: Adrenal glands are unremarkable. Kidneys are normal, without renal calculi, focal lesion, or hydronephrosis. Bladder is unremarkable. Stomach/Bowel: Stomach is within normal limits. Appendix appears normal. No evidence of small bowel wall thickening, distention, or inflammatory changes. Mild diffuse mucosal thickening of the distal transverse, proximal descending and parts of the sigmoid colon. Scattered left colonic diverticula. Vascular/Lymphatic: Aortic atherosclerosis. No enlarged abdominal or pelvic lymph nodes. Reproductive: Uterus and bilateral adnexa are unremarkable. Other: No abdominal wall hernia or abnormality. No abdominopelvic ascites. Musculoskeletal: No acute or significant osseous findings. IMPRESSION: 1. Mild hepatic steatosis. 2. Mild inflammatory changes of the distal transverse, proximal descending and parts of the sigmoid colon. This may represent infectious/inflammatory or ischemic colitis. Scattered colonic diverticulosis. 3. Changes of COPD. Electronically Signed   By: Fidela Salisbury M.D.   On: 06/10/2019 21:20    Pending Labs Unresulted Labs (From admission, onward)    Start     Ordered   06/10/19 2247  Gastrointestinal Panel by PCR , Stool  (Gastrointestinal Panel by PCR, Stool)  Once,   STAT     06/10/19 2246   06/10/19 2247  C difficile quick scan w PCR reflex  (C Difficile quick screen w PCR reflex panel)  Once, for 24 hours,   STAT     06/10/19 2246   Signed and Held  HIV antibody (Routine Testing)  Once,   R     Signed and Held    Signed and Held  Basic metabolic panel  Tomorrow morning,   R     Signed and Held   Signed and Held  CBC  Tomorrow morning,   R     Signed and Held          Vitals/Pain Today's Vitals   06/10/19 2224 06/10/19 2230 06/10/19 2300 06/10/19 2330  BP:  129/77 110/70   Pulse:  67 66 67  Resp:  18    Temp:      TempSrc:      SpO2:  98% 96% 96%  Weight:      Height:      PainSc: 0-No pain       Isolation Precautions Enteric precautions (UV disinfection)  Medications Medications  sodium chloride 0.9 % bolus 1,000 mL (0 mLs Intravenous Stopped 06/10/19 2303)  ondansetron (ZOFRAN) injection 4 mg (4 mg Intravenous Given 06/10/19 2040)  iohexol (OMNIPAQUE) 240 MG/ML injection 50 mL (50 mLs Oral Contrast Given 06/10/19 2033)  acetaminophen (TYLENOL) tablet 1,000 mg (1,000 mg Oral Given 06/10/19 2046)  iohexol (OMNIPAQUE) 300 MG/ML solution 100 mL (100 mLs Intravenous Contrast Given 06/10/19 2103)    Mobility walks Low fall risk   Focused Assessments Cardiac Assessment Handoff:    No results found for: CKTOTAL, CKMB, CKMBINDEX, TROPONINI No results found for: DDIMER Does the Patient currently have chest pain? No      R Recommendations: See Admitting Provider Note  Report given to:   Additional Notes: n/a

## 2019-06-11 NOTE — Consult Note (Signed)
Natasha Lame, MD [facility]  [address] [place], [place] [address] Phone: [phone] Fax : [phone]  Consultation  Referring Provider:     Dr. Posey Pronto Primary Care Physician:  Hortencia Pilar, MD Primary Gastroenterologist: Althia Forts         Reason for Consultation:     Colitis  Date of Admission:  06/10/2019 Date of Consultation:  06/11/2019         HPI:   Natasha [name] is a 63 y.o. female who came in with abdominal pain and rectal bleeding.  The patient has a history of C. difficile colitis but came in with C. difficile negative and a GI panel was also negative.  The patient states that she has had abdominal pain for the last few weeks.  She also had a similar episode of rectal bleeding with abdominal pain about a year ago where she went to [facility] and was treated symptomatically and conservatively.  The patient also had a colonoscopy at [facility] in the past.  The colonoscopy was in June 2018.  At that time the patient was reported to have a polyp.  It was recommended that the patient have a repeat colonoscopy in 3 years after that last one.  The patient had been seen in the past by Dr. Candace Cruise.  The patient's white cell count on admission was 11.9 and was down to 6.8 today.  The patient also had a hemoglobin of 14.6 on admission and it is 13.4 this morning.  The patient reports that her abdominal pain was a 6 out of 10 yesterday and it is down to a 4 out of 10 today with approximately 1 bloody bowel movement per day and her last one was last night. The patients CT scan showed: IMPRESSION: 1. Mild hepatic steatosis. 2. Mild inflammatory changes of the distal transverse, proximal descending and parts of the sigmoid colon. This may represent infectious/inflammatory or ischemic colitis. Scattered colonic diverticulosis. 3. Changes of COPD.  Past Medical History:  Diagnosis Date  . Acid reflux   . GERD (gastroesophageal reflux disease)   . Hyperlipidemia   . Osteoporosis   .  Thrombophlebitis 1983  . Unspecified venous (peripheral) insufficiency     Past Surgical History:  Procedure Laterality Date  . CHOLECYSTECTOMY  2007  . COLONOSCOPY  2008  . LAPAROSCOPY  2007    Prior to Admission medications   Medication Sig Start Date End Date Taking? Authorizing Provider  albuterol (PROVENTIL) (2.5 MG/3ML) 0.083% nebulizer solution Take 2.5 mg by nebulization every 6 (six) hours as needed for wheezing.   Yes [provider]  cetirizine (ZYRTEC) 10 MG tablet Take 10 mg by mouth at bedtime.   Yes [provider]  Docosanol 10 % CREA Apply 1 application topically 5 (five) times daily as needed.   Yes [provider]  ketoconazole (NIZORAL) 2 % shampoo Apply 1 application topically as needed for irritation (dry scalp).   Yes [provider]  Probiotic Product (PROBIOTIC DAILY PO) Take 1 capsule by mouth every morning. [place] OTC product   Yes [provider]  albuterol (PROVENTIL HFA;VENTOLIN HFA) 108 (90 Base) MCG/ACT inhaler Inhale into the lungs every 6 (six) hours as needed for wheezing or shortness of breath.    [provider]  Calcium Citrate-Vitamin D (CALCIUM + D PO) Take 1 tablet by mouth daily.     [provider]    Family History  Problem Relation Age of Onset  .  Breast cancer Mother 31     Social History   Tobacco Use  . Smoking status: Current Every Day Smoker    Packs/day: 1.00    Years: 30.00    Pack years: 30.00  . Smokeless tobacco: Never Used  Substance Use Topics  . Alcohol use: Yes  . Drug use: No    Allergies as of 06/10/2019 - Review Complete 06/10/2019  Allergen Reaction Noted  . Codeine Rash and Hives 02/21/2016  . Hydrocodone-acetaminophen Nausea Only 03/31/2016    Review of Systems:    All systems reviewed and negative except where noted in HPI.   Physical Exam:  Vital signs in last 24 hours: Temp:  [97.7 F (36.5 C)-98.9 F (37.2 C)] 98.4 F  (36.9 C) (07/07 1007) Pulse Rate:  [49-84] 61 (07/07 1007) Resp:  [16-19] 18 (07/07 1007) BP: (100-133)/(64-106) 106/69 (07/07 1007) SpO2:  [95 %-99 %] 95 % (07/07 1007) Weight:  [62.6 kg] 62.6 kg (07/06 1637) Last BM Date: 06/11/19 General:   Pleasant, cooperative in NAD Head:  Normocephalic and atraumatic. Eyes:   No icterus.   Conjunctiva pink. PERRLA. Ears:  Normal auditory acuity. Neck:  Supple; no masses or thyroidomegaly Lungs: Respirations even and unlabored. Lungs clear to auscultation bilaterally.   No wheezes, crackles, or rhonchi.  Heart:  Regular rate and rhythm;  Without murmur, clicks, rubs or gallops Abdomen:  Soft, nondistended, diffusely mildly tender but more in the left. Normal bowel sounds. No appreciable masses or hepatomegaly.  No rebound or guarding.  Rectal:  Not performed. Msk:  Symmetrical without gross deformities.   Extremities:  Without edema, cyanosis or clubbing. Neurologic:  Alert and oriented x3;  grossly normal neurologically. Skin:  Intact without significant lesions or rashes. Cervical Nodes:  No significant cervical adenopathy. Psych:  Alert and cooperative. Normal affect.  LAB RESULTS: Recent Labs    06/10/19 1642 06/11/19 0253 06/11/19 0716  WBC 11.4*  11.9* 6.8  --   HGB 14.7  14.6 13.0 13.4  HCT 44.2  43.7 38.3 40.4  PLT 310  287 235  --    BMET Recent Labs    06/10/19 1642 06/11/19 0253  NA 132* 136  K 4.3 4.5  CL 95* 103  CO2 27 25  GLUCOSE 112* 104*  BUN 10 7*  CREATININE 0.78 0.72  CALCIUM 8.7* 8.0*   LFT Recent Labs    06/10/19 1642  PROT 6.7  ALBUMIN 4.2  AST 22  ALT 18  ALKPHOS 92  BILITOT 0.5   PT/INR No results for input(s): LABPROT, INR in the last 72 hours.  STUDIES: Ct Abdomen Pelvis W Contrast  Result Date: 06/10/2019 CLINICAL DATA:  Left lower quadrant abdominal pain. EXAM: CT ABDOMEN AND PELVIS WITH CONTRAST TECHNIQUE: Multidetector CT imaging of the abdomen and pelvis was performed using the  standard protocol following bolus administration of intravenous contrast. CONTRAST:  174mL OMNIPAQUE IOHEXOL 300 MG/ML  SOLN COMPARISON:  02/21/2016 FINDINGS: Lower chest: Changes of COPD. Hepatobiliary: Mild hepatic steatosis. Postcholecystectomy. No evidence of biliary ductal dilation. Pancreas: Unremarkable. No pancreatic ductal dilatation or surrounding inflammatory changes. Spleen: Normal in size without focal abnormality. Adrenals/Urinary Tract: Adrenal glands are unremarkable. Kidneys are normal, without renal calculi, focal lesion, or hydronephrosis. Bladder is unremarkable. Stomach/Bowel: Stomach is within normal limits. Appendix appears normal. No evidence of small bowel wall thickening, distention, or inflammatory changes. Mild diffuse mucosal thickening of the distal transverse, proximal descending and parts of the sigmoid colon. Scattered left colonic diverticula. Vascular/Lymphatic: Aortic  atherosclerosis. No enlarged abdominal or pelvic lymph nodes. Reproductive: Uterus and bilateral adnexa are unremarkable. Other: No abdominal wall hernia or abnormality. No abdominopelvic ascites. Musculoskeletal: No acute or significant osseous findings. IMPRESSION: 1. Mild hepatic steatosis. 2. Mild inflammatory changes of the distal transverse, proximal descending and parts of the sigmoid colon. This may represent infectious/inflammatory or ischemic colitis. Scattered colonic diverticulosis. 3. Changes of COPD. Electronically Signed   By: Fidela Salisbury M.D.   On: 06/10/2019 21:20      Impression / Plan:   Assessment: Active Problems:   Acute colitis   Natasha [name] is a 63 y.o. y/o female with colitis and a negative C. difficile GI panel.  The patient had a similar episode of this last year.  It is likely that this patient is having ischemic colitis based on her CT scans and negative stool test.  Plan:  The patient will be treated conservatively with IV hydration and this will continue to  improve if it is ischemic colitis which is the most likely diagnosis with her abdominal pains cramps and hematochezia.  The patient has been explained the plan and has been told that she needs another colonoscopy in a year because of her history of a polyp in 2018 with a recommended repeat in 3 years.  The patient has been explained the plan and agrees with it.  Thank you for involving me in the care of this patient.      LOS: 1 day   Natasha Lame, MD  06/11/2019, 1:20 PM    Note: This dictation was prepared with Dragon dictation along with smaller phrase technology. Any transcriptional errors that result from this process are unintentional.

## 2019-06-11 NOTE — ED Notes (Signed)
Admitting MD at the bedside for pt evaluation.  

## 2019-06-12 LAB — HIV ANTIBODY (ROUTINE TESTING W REFLEX): HIV Screen 4th Generation wRfx: NONREACTIVE

## 2019-06-12 LAB — NOVEL CORONAVIRUS, NAA (HOSP ORDER, SEND-OUT TO REF LAB; TAT 18-24 HRS): SARS-CoV-2, NAA: NOT DETECTED

## 2019-06-12 MED ORDER — CEFDINIR 300 MG PO CAPS: 300.0000 mg | ORAL_CAPSULE | Freq: Two times a day (BID) | ORAL | 0 refills | Status: DC

## 2019-06-12 MED ORDER — CEFDINIR 300 MG PO CAPS
300.0000 mg | ORAL_CAPSULE | Freq: Two times a day (BID) | ORAL | Status: DC
  Administered 2019-06-12: 13:00:00 300 mg via ORAL
  Filled 2019-06-12 (×2): qty 1

## 2019-06-12 MED ORDER — METRONIDAZOLE 500 MG PO TABS
500.0000 mg | ORAL_TABLET | Freq: Three times a day (TID) | ORAL | Status: DC
  Filled 2019-06-12 (×2): qty 1

## 2019-06-12 MED ORDER — NYSTATIN 100000 UNIT/ML MT SUSP: 5.0000 mL | Freq: Four times a day (QID) | OROMUCOSAL | 0 refills | Status: DC

## 2019-06-12 MED ORDER — METRONIDAZOLE 500 MG PO TABS: 500.0000 mg | ORAL_TABLET | Freq: Three times a day (TID) | ORAL | 0 refills | Status: DC

## 2019-06-12 NOTE — Progress Notes (Signed)
Nsg Discharge Note  Admit Date:  06/10/2019 Discharge date: 06/12/2019   Natasha [name] to be D/C'd Home per MD order.  AVS completed.  Copy for chart, and copy for patient signed, and dated. Patient/caregiver able to verbalize understanding.  Discharge Medication: Allergies as of 06/12/2019      Reactions   Codeine Rash, Hives   Ciprofloxacin Diarrhea   C. diff   Hydrocodone-acetaminophen Nausea Only      Medication List    TAKE these medications   albuterol 108 (90 Base) MCG/ACT inhaler Commonly known as: VENTOLIN HFA Inhale into the lungs every 6 (six) hours as needed for wheezing or shortness of breath.   albuterol (2.5 MG/3ML) 0.083% nebulizer solution Commonly known as: PROVENTIL Take 2.5 mg by nebulization every 6 (six) hours as needed for wheezing.   CALCIUM + D PO Take 1 tablet by mouth daily.   cefdinir 300 MG capsule Commonly known as: OMNICEF Take 1 capsule (300 mg total) by mouth every 12 (twelve) hours.   cetirizine 10 MG tablet Commonly known as: ZYRTEC Take 10 mg by mouth at bedtime.   Docosanol 10 % Crea Apply 1 application topically 5 (five) times daily as needed.   ketoconazole 2 % shampoo Commonly known as: NIZORAL Apply 1 application topically as needed for irritation (dry scalp).   metroNIDAZOLE 500 MG tablet Commonly known as: FLAGYL Take 1 tablet (500 mg total) by mouth every 8 (eight) hours.   nystatin 100000 UNIT/ML suspension Commonly known as: MYCOSTATIN Take 5 mLs (500,000 Units total) by mouth 4 (four) times daily.   PROBIOTIC DAILY PO Take 1 capsule by mouth every morning. [place] OTC product       Discharge Assessment: Vitals:   06/12/19 0457 06/12/19 1512  BP: (!) 100/49 120/72  Pulse: (!) 54 66  Resp: 18   Temp: 97.8 F (36.6 C) 98.5 F (36.9 C)  SpO2: 98% 97%   Skin clean, dry and intact without evidence of skin break down, no evidence of skin tears noted. IV catheter discontinued intact. Site without  signs and symptoms of complications - no redness or edema noted at insertion site, patient denies c/o pain - only slight tenderness at site.  Dressing with slight pressure applied.  D/c Instructions-Education: Discharge instructions given to patient/family with verbalized understanding. D/c education completed with patient/family including follow up instructions, medication list, d/c activities limitations if indicated, with other d/c instructions as indicated by MD - patient able to verbalize understanding, all questions fully answered. Patient instructed to return to ED, call 911, or call MD for any changes in condition.  Patient escorted via [name], and D/C home via private auto.  Natasha Keys, RN 06/12/2019 3:53 PM

## 2019-06-12 NOTE — Discharge Summary (Signed)
[facility] [place] NAME: Natasha [name]    MR#:  [id number]  DATE OF BIRTH:  [birth year]  DATE OF ADMISSION:  06/10/2019 ADMITTING PHYSICIAN: Christel Mormon, MD  DATE OF DISCHARGE: 06/12/2019  PRIMARY CARE PHYSICIAN: Hortencia Pilar, MD    ADMISSION DIAGNOSIS:  Colitis [K52.9] Abdominal pain, unspecified abdominal location [R10.9]  DISCHARGE DIAGNOSIS:  Acute Colitis  SECONDARY DIAGNOSIS:   Past Medical History:  Diagnosis Date  . Acid reflux   . GERD (gastroesophageal reflux disease)   . Hyperlipidemia   . Osteoporosis   . Thrombophlebitis 1983  . Unspecified venous (peripheral) insufficiency     HOSPITAL COURSE:  PamelaBoggsis a63 y.o.pleasant occasion femalewith a known history of COPD, tobacco abuse, dyslipidemia, C. difficile and herpes zoster as well as left lower extremity DVT, who presented to the emergency room with acute onset of left lower quadrant abdominal pain which has been intermittent over the last 2 weeks. She started having diarrhea this morning and noticed bright red blood with her bowel movements this evening  1. Acute colitis  infectious vs ischemic colitis given associated GI bleeding - - on hydration with IV normal saline.  -on IV Flagyl and rocephin--change to oral abxs -c difficile negative.  -GI PCR negative -stool lactoferrin positive -GI consultation will be obtained by Dr.Wohl --ok to d/c and f//u out pt -hgb stable  2. oral thrush -nystatin S and S--pt has recent abxs  3. COPD - No current exacerbation.  -She will be placed on her PRN albuterol MDI.  4. Tobacco abuse -She has a 14 mg NicoDerm CQ patch that will be continued.  5. Dyslipidemia. - diet managed.  6. DVT prophylaxis. - on SCDs.   D/c home with out pt f/u GI Spoke with dter tonya CONSULTS OBTAINED:  Treatment Team:  [name], MD  DRUG ALLERGIES:   Allergies  Allergen Reactions  .  Codeine Rash and Hives  . Ciprofloxacin Diarrhea    C. diff  . Hydrocodone-Acetaminophen Nausea Only    DISCHARGE MEDICATIONS:   Allergies as of 06/12/2019      Reactions   Codeine Rash, Hives   Ciprofloxacin Diarrhea   C. diff   Hydrocodone-acetaminophen Nausea Only      Medication List    TAKE these medications   albuterol 108 (90 Base) MCG/ACT inhaler Commonly known as: VENTOLIN HFA Inhale into the lungs every 6 (six) hours as needed for wheezing or shortness of breath.   albuterol (2.5 MG/3ML) 0.083% nebulizer solution Commonly known as: PROVENTIL Take 2.5 mg by nebulization every 6 (six) hours as needed for wheezing.   CALCIUM + D PO Take 1 tablet by mouth daily.   cefdinir 300 MG capsule Commonly known as: OMNICEF Take 1 capsule (300 mg total) by mouth every 12 (twelve) hours.   cetirizine 10 MG tablet Commonly known as: ZYRTEC Take 10 mg by mouth at bedtime.   Docosanol 10 % Crea Apply 1 application topically 5 (five) times daily as needed.   ketoconazole 2 % shampoo Commonly known as: NIZORAL Apply 1 application topically as needed for irritation (dry scalp).   metroNIDAZOLE 500 MG tablet Commonly known as: FLAGYL Take 1 tablet (500 mg total) by mouth every 8 (eight) hours.   nystatin 100000 UNIT/ML suspension Commonly known as: MYCOSTATIN Take 5 mLs (500,000 Units total) by mouth 4 (four) times daily.   PROBIOTIC DAILY PO Take 1 capsule by mouth every morning. [place] OTC product  If you experience worsening of your admission symptoms, develop shortness of breath, life threatening emergency, suicidal or homicidal thoughts you must seek medical attention immediately by calling 911 or calling your MD immediately  if symptoms less severe.  You Must read complete instructions/literature along with all the possible adverse reactions/side effects for all the Medicines you take and that have been prescribed to you. Take any new Medicines  after you have completely understood and accept all the possible adverse reactions/side effects.   Please note  You were cared for by a hospitalist during your hospital stay. If you have any questions about your discharge medications or the care you received while you were in the hospital after you are discharged, you can call the unit and asked to speak with the hospitalist on call if the hospitalist that took care of you is not available. Once you are discharged, your primary care physician will handle any further medical issues. Please note that NO REFILLS for any discharge medications will be authorized once you are discharged, as it is imperative that you return to your primary care physician (or establish a relationship with a primary care physician if you do not have one) for your aftercare needs so that they can reassess your need for medications and monitor your lab values. Today   SUBJECTIVE  Some diarrhea--non bloody this am   VITAL SIGNS:  Blood pressure (!) 100/49, pulse (!) 54, temperature 97.8 F (36.6 C), temperature source Oral, resp. rate 18, height 5\' 2"  (1.575 m), weight 62.6 kg, SpO2 98 %.  I/O:    Intake/Output Summary (Last 24 hours) at 06/12/2019 1245 Last data filed at 06/12/2019 0519 Gross per 24 hour  Intake 1902.88 ml  Output 3440 ml  Net -1537.12 ml    PHYSICAL EXAMINATION:  GENERAL:  63 y.o.-year-old patient lying in the bed with no acute distress.  EYES: Pupils equal, round, reactive to light and accommodation. No scleral icterus. Extraocular muscles intact.  HEENT: Head atraumatic, normocephalic. Oropharynx and nasopharynx clear.  NECK:  Supple, no jugular venous distention. No thyroid enlargement, no tenderness.  LUNGS: Normal breath sounds bilaterally, no wheezing, rales,rhonchi or crepitation. No use of accessory muscles of respiration.  CARDIOVASCULAR: S1, S2 normal. No murmurs, rubs, or gallops.  ABDOMEN: Soft, non-tender, non-distended. Bowel sounds  present. No organomegaly or mass.  EXTREMITIES: No pedal edema, cyanosis, or clubbing.  NEUROLOGIC: Cranial nerves II through XII are intact. Muscle strength 5/5 in all extremities. Sensation intact. Gait not checked.  PSYCHIATRIC: The patient is alert and oriented x 3.  SKIN: No obvious rash, lesion, or ulcer.   DATA REVIEW:   CBC  Recent Labs  Lab 06/11/19 0253  06/11/19 1913  WBC 6.8  --   --   HGB 13.0   < > 13.3  HCT 38.3   < > 40.8  PLT 235  --   --    < > = values in this interval not displayed.    Chemistries  Recent Labs  Lab 06/10/19 1642 06/11/19 0253  NA 132* 136  K 4.3 4.5  CL 95* 103  CO2 27 25  GLUCOSE 112* 104*  BUN 10 7*  CREATININE 0.78 0.72  CALCIUM 8.7* 8.0*  AST 22  --   ALT 18  --   ALKPHOS 92  --   BILITOT 0.5  --     Microbiology Results   Recent Results (from the past 240 hour(s))  Gastrointestinal Panel by PCR , Stool  Status: None   Collection Time: 06/11/19 12:47 AM   Specimen: Stool  Result Value Ref Range Status   Campylobacter species NOT DETECTED NOT DETECTED Final   Plesimonas shigelloides NOT DETECTED NOT DETECTED Final   Salmonella species NOT DETECTED NOT DETECTED Final   Yersinia enterocolitica NOT DETECTED NOT DETECTED Final   Vibrio species NOT DETECTED NOT DETECTED Final   Vibrio cholerae NOT DETECTED NOT DETECTED Final   Enteroaggregative E coli (EAEC) NOT DETECTED NOT DETECTED Final   Enteropathogenic E coli (EPEC) NOT DETECTED NOT DETECTED Final   Enterotoxigenic E coli (ETEC) NOT DETECTED NOT DETECTED Final   Shiga like toxin producing E coli (STEC) NOT DETECTED NOT DETECTED Final   Shigella/Enteroinvasive E coli (EIEC) NOT DETECTED NOT DETECTED Final   Cryptosporidium NOT DETECTED NOT DETECTED Final   Cyclospora cayetanensis NOT DETECTED NOT DETECTED Final   Entamoeba histolytica NOT DETECTED NOT DETECTED Final   Giardia lamblia NOT DETECTED NOT DETECTED Final   Adenovirus F40/41 NOT DETECTED NOT DETECTED  Final   Astrovirus NOT DETECTED NOT DETECTED Final   Norovirus GI/GII NOT DETECTED NOT DETECTED Final   Rotavirus A NOT DETECTED NOT DETECTED Final   Sapovirus (I, II, IV, and V) NOT DETECTED NOT DETECTED Final    Comment: Performed at [facility], [address] [place], [place] [address]  C difficile quick scan w PCR reflex     Status: None   Collection Time: 06/11/19 12:47 AM   Specimen: Stool  Result Value Ref Range Status   C Diff antigen NEGATIVE NEGATIVE Final   C Diff toxin NEGATIVE NEGATIVE Final   C Diff interpretation No C. difficile detected.  Final    Comment: Performed at [facility], [address] [place], [place] [address]    RADIOLOGY:  Ct Abdomen Pelvis W Contrast  Result Date: 06/10/2019 CLINICAL DATA:  Left lower quadrant abdominal pain. EXAM: CT ABDOMEN AND PELVIS WITH CONTRAST TECHNIQUE: Multidetector CT imaging of the abdomen and pelvis was performed using the standard protocol following bolus administration of intravenous contrast. CONTRAST:  124mL OMNIPAQUE IOHEXOL 300 MG/ML  SOLN COMPARISON:  02/21/2016 FINDINGS: Lower chest: Changes of COPD. Hepatobiliary: Mild hepatic steatosis. Postcholecystectomy. No evidence of biliary ductal dilation. Pancreas: Unremarkable. No pancreatic ductal dilatation or surrounding inflammatory changes. Spleen: Normal in size without focal abnormality. Adrenals/Urinary Tract: Adrenal glands are unremarkable. Kidneys are normal, without renal calculi, focal lesion, or hydronephrosis. Bladder is unremarkable. Stomach/Bowel: Stomach is within normal limits. Appendix appears normal. No evidence of small bowel wall thickening, distention, or inflammatory changes. Mild diffuse mucosal thickening of the distal transverse, proximal descending and parts of the sigmoid colon. Scattered left colonic diverticula. Vascular/Lymphatic: Aortic atherosclerosis. No enlarged abdominal or pelvic lymph nodes. Reproductive: Uterus and  bilateral adnexa are unremarkable. Other: No abdominal wall hernia or abnormality. No abdominopelvic ascites. Musculoskeletal: No acute or significant osseous findings. IMPRESSION: 1. Mild hepatic steatosis. 2. Mild inflammatory changes of the distal transverse, proximal descending and parts of the sigmoid colon. This may represent infectious/inflammatory or ischemic colitis. Scattered colonic diverticulosis. 3. Changes of COPD. Electronically Signed   By: Fidela Salisbury M.D.   On: 06/10/2019 21:20     CODE STATUS:     Code Status Orders  (From admission, onward)         Start     Ordered   06/11/19 0051  Full code  Continuous     06/11/19 0050        Code Status History  This patient has a current code status but no historical code status.   Advance Care Planning Activity      TOTAL TIME TAKING CARE OF THIS PATIENT: 40 minutes.    Fritzi Mandes M.D on 06/12/2019 at 12:45 PM  Between 7am to 6pm - Pager - [phone] After 6pm go to [email or web address] - password [email or web address] [facility]  Office  [phone]  CC: Primary care physician; Hortencia Pilar, MD

## 2019-06-13 ENCOUNTER — Telehealth: Payer: Self-pay | Admitting: Gastroenterology

## 2019-06-13 NOTE — Telephone Encounter (Signed)
I have called & l/m for pt to call & schedule a 1wk appt hospital f/u colitis with any doctor per Ginger.(week of 7-13 thru 7-17

## 2019-06-16 LAB — STOOL CULTURE: E coli, Shiga toxin Assay: NEGATIVE

## 2019-06-16 LAB — STOOL CULTURE REFLEX - RSASHR

## 2019-06-16 LAB — STOOL CULTURE REFLEX - CMPCXR

## 2019-06-17 ENCOUNTER — Ambulatory Visit (INDEPENDENT_AMBULATORY_CARE_PROVIDER_SITE_OTHER): Payer: PRIVATE HEALTH INSURANCE | Admitting: Gastroenterology

## 2019-06-17 ENCOUNTER — Encounter: Payer: Self-pay | Admitting: Gastroenterology

## 2019-06-17 ENCOUNTER — Other Ambulatory Visit: Payer: Self-pay

## 2019-06-17 VITALS — BP 110/77 | HR 68 | Temp 97.7°F | Ht 62.0 in | Wt 138.8 lb

## 2019-06-17 DIAGNOSIS — K529 Noninfective gastroenteritis and colitis, unspecified: Secondary | ICD-10-CM | POA: Diagnosis not present

## 2019-06-17 NOTE — Progress Notes (Signed)
Natasha Bellows MD, MRCP(U.K) [address]  [place]  [place], [place] [address]  Main: [phone]  Fax: [phone]   Primary Care Physician: Hortencia Pilar, MD  Primary Gastroenterologist:  Dr. Harland German follow-up  HPI: Natasha [name] is a 63 y.o. female was admitted to hospital on 06/10/2019 and seen by my colleague Dr. Allen Norris for colitis.  Patient has a history of C. difficile colitis.  Last colonoscopy was in June 2018.  And recalls had a polyp.  Was suggested to have repeat colonoscopy in 3 years.  On admission had abdominal pain and one bloody bowel movement.  CT scan on admission showed inflammatory changes of the distal transverse colon proximal descending and part of the sigmoid colon.  Differentials include infectious versus an ischemic or inflammatory colitis.  Impression at that point of time was it likely was an ischemic colitis.  Fecal lactoferrin was elevated.  At discharge she was given a course of Flagyl and cefdinir.  Since going home she has had no rectal bleeding but has had abdominal discomfort on the left side along with multiple bowel movements per day up to 4 a day.  Low energy levels.  Overall she does feel better but not completely back to normal.  Current Outpatient Medications  Medication Sig Dispense Refill   albuterol (PROVENTIL HFA;VENTOLIN HFA) 108 (90 Base) MCG/ACT inhaler Inhale into the lungs every 6 (six) hours as needed for wheezing or shortness of breath.     albuterol (PROVENTIL) (2.5 MG/3ML) 0.083% nebulizer solution Take 2.5 mg by nebulization every 6 (six) hours as needed for wheezing.     Calcium Citrate-Vitamin D (CALCIUM + D PO) Take 1 tablet by mouth daily.      cefdinir (OMNICEF) 300 MG capsule Take 1 capsule (300 mg total) by mouth every 12 (twelve) hours. 16 capsule 0   cetirizine (ZYRTEC) 10 MG tablet Take 10 mg by mouth at bedtime.     Docosanol 10 % CREA Apply 1 application topically 5 (five) times daily as needed.       ketoconazole (NIZORAL) 2 % shampoo Apply 1 application topically as needed for irritation (dry scalp).     metroNIDAZOLE (FLAGYL) 500 MG tablet Take 1 tablet (500 mg total) by mouth every 8 (eight) hours. 24 tablet 0   nystatin (MYCOSTATIN) 100000 UNIT/ML suspension Take 5 mLs (500,000 Units total) by mouth 4 (four) times daily. 60 mL 0   Probiotic Product (PROBIOTIC DAILY PO) Take 1 capsule by mouth every morning. [place] OTC product     No current facility-administered medications for this visit.     Allergies as of 06/17/2019 - Review Complete 06/10/2019  Allergen Reaction Noted   Codeine Rash and Hives 02/21/2016   Ciprofloxacin Diarrhea 06/11/2019   Hydrocodone-acetaminophen Nausea Only 03/31/2016    ROS:  General: Negative for anorexia, weight loss, fever, chills, fatigue, weakness. ENT: Negative for hoarseness, difficulty swallowing , nasal congestion. CV: Negative for chest pain, angina, palpitations, dyspnea on exertion, peripheral edema.  Respiratory: Negative for dyspnea at rest, dyspnea on exertion, cough, sputum, wheezing.  GI: See history of present illness. GU:  Negative for dysuria, hematuria, urinary incontinence, urinary frequency, nocturnal urination.  Endo: Negative for unusual weight change.    Physical Examination:   There were no vitals taken for this visit.  General: Well-nourished, well-developed in no acute distress.  Eyes: No icterus. Conjunctivae pink. Mouth: Oropharyngeal mucosa moist and pink , no lesions erythema or exudate. Lungs:  Clear to auscultation bilaterally. Non-labored. Heart: Regular rate and rhythm, no murmurs rubs or gallops.  Abdomen: Bowel sounds are normal, minimal left lower quadrant tenderness, nondistended, no hepatosplenomegaly or masses, no abdominal bruits or hernia , no rebound or guarding.   Extremities: No lower extremity edema. No clubbing or deformities. Neuro: Alert and oriented x 3.  Grossly  intact. Skin: Warm and dry, no jaundice.   Psych: Alert and cooperative, normal mood and affect.   Imaging Studies: Ct Abdomen Pelvis W Contrast  Result Date: 06/10/2019 CLINICAL DATA:  Left lower quadrant abdominal pain. EXAM: CT ABDOMEN AND PELVIS WITH CONTRAST TECHNIQUE: Multidetector CT imaging of the abdomen and pelvis was performed using the standard protocol following bolus administration of intravenous contrast. CONTRAST:  11mL OMNIPAQUE IOHEXOL 300 MG/ML  SOLN COMPARISON:  02/21/2016 FINDINGS: Lower chest: Changes of COPD. Hepatobiliary: Mild hepatic steatosis. Postcholecystectomy. No evidence of biliary ductal dilation. Pancreas: Unremarkable. No pancreatic ductal dilatation or surrounding inflammatory changes. Spleen: Normal in size without focal abnormality. Adrenals/Urinary Tract: Adrenal glands are unremarkable. Kidneys are normal, without renal calculi, focal lesion, or hydronephrosis. Bladder is unremarkable. Stomach/Bowel: Stomach is within normal limits. Appendix appears normal. No evidence of small bowel wall thickening, distention, or inflammatory changes. Mild diffuse mucosal thickening of the distal transverse, proximal descending and parts of the sigmoid colon. Scattered left colonic diverticula. Vascular/Lymphatic: Aortic atherosclerosis. No enlarged abdominal or pelvic lymph nodes. Reproductive: Uterus and bilateral adnexa are unremarkable. Other: No abdominal wall hernia or abnormality. No abdominopelvic ascites. Musculoskeletal: No acute or significant osseous findings. IMPRESSION: 1. Mild hepatic steatosis. 2. Mild inflammatory changes of the distal transverse, proximal descending and parts of the sigmoid colon. This may represent infectious/inflammatory or ischemic colitis. Scattered colonic diverticulosis. 3. Changes of COPD. Electronically Signed   By: Fidela Salisbury M.D.   On: 06/10/2019 21:20    Assessment and Plan:   Natasha [name] is a 63 y.o. y/o female here to  follow-up for her hospital discharge.  She was admitted a week back and found to have left-sided colitis.  Negative for infection based on stool test.  Felt likely due to be from ischemic colitis.  Here for a follow-up.  Overall she seems to be doing better but she still has some for symptoms including abdominal discomfort and low energy levels.  I suggested this all could be side effects of the Flagyl.  No strong indications for antibiotics to be given for her colitis since that stool tests were negative.  Plan 1.  Check CBC CMP CRP today 2.  Stop antibiotics. 3.  Our office will call her in 48 hours to see how she is doing.  If not doing better we may need to perform a flexible sigmoidoscopy to rule out ulcerative colitis.  Low threshold to recheck for C. difficile diarrhea as she has been on antibiotics recently.    Dr Natasha Bellows  MD,MRCP [facility]) Follow up in 10 days

## 2019-06-18 LAB — CBC WITH DIFFERENTIAL/PLATELET
Basophils Absolute: 0 10*3/uL (ref 0.0–0.2)
Basos: 0 %
EOS (ABSOLUTE): 0 10*3/uL (ref 0.0–0.4)
Eos: 0 %
Hematocrit: 41.9 % (ref 34.0–46.6)
Hemoglobin: 14.3 g/dL (ref 11.1–15.9)
Immature Grans (Abs): 0 10*3/uL (ref 0.0–0.1)
Immature Granulocytes: 0 %
Lymphocytes Absolute: 2 10*3/uL (ref 0.7–3.1)
Lymphs: 21 %
MCH: 28.9 pg (ref 26.6–33.0)
MCHC: 34.1 g/dL (ref 31.5–35.7)
MCV: 85 fL (ref 79–97)
Monocytes Absolute: 0.6 10*3/uL (ref 0.1–0.9)
Monocytes: 7 %
Neutrophils Absolute: 6.7 10*3/uL (ref 1.4–7.0)
Neutrophils: 72 %
Platelets: 278 10*3/uL (ref 150–450)
RBC: 4.95 x10E6/uL (ref 3.77–5.28)
RDW: 12.7 % (ref 11.7–15.4)
WBC: 9.4 10*3/uL (ref 3.4–10.8)

## 2019-06-18 LAB — COMPREHENSIVE METABOLIC PANEL
ALT: 27 IU/L (ref 0–32)
AST: 24 IU/L (ref 0–40)
Albumin/Globulin Ratio: 2.3 — ABNORMAL HIGH (ref 1.2–2.2)
Albumin: 4.1 g/dL (ref 3.8–4.8)
Alkaline Phosphatase: 88 IU/L (ref 39–117)
BUN/Creatinine Ratio: 11 — ABNORMAL LOW (ref 12–28)
BUN: 9 mg/dL (ref 8–27)
Bilirubin Total: 0.3 mg/dL (ref 0.0–1.2)
CO2: 24 mmol/L (ref 20–29)
Calcium: 9 mg/dL (ref 8.7–10.3)
Chloride: 97 mmol/L (ref 96–106)
Creatinine, Ser: 0.83 mg/dL (ref 0.57–1.00)
GFR calc Af Amer: 87 mL/min/{1.73_m2} (ref >59)
GFR calc non Af Amer: 76 mL/min/{1.73_m2} (ref >59)
Globulin, Total: 1.8 g/dL (ref 1.5–4.5)
Glucose: 101 mg/dL — ABNORMAL HIGH (ref 65–99)
Potassium: 4.5 mmol/L (ref 3.5–5.2)
Sodium: 136 mmol/L (ref 134–144)
Total Protein: 5.9 g/dL — ABNORMAL LOW (ref 6.0–8.5)

## 2019-06-18 LAB — C-REACTIVE PROTEIN: CRP: <1 mg/L (ref 0–10)

## 2019-06-24 ENCOUNTER — Telehealth: Payer: Self-pay | Admitting: Gastroenterology

## 2019-06-24 NOTE — Telephone Encounter (Signed)
Patient l/m on vm stating she was much better.

## 2019-06-25 NOTE — Telephone Encounter (Signed)
If doing well then postpone follow up by 3-4 weeks , she can call in if sick to see me sooner  Dr Jonathon Bellows MD,MRCP [facility]) Gastroenterology/Hepatology Pager: [phone]

## 2019-06-27 NOTE — Telephone Encounter (Signed)
Spoke with pt and informed her of Dr. Georgeann Oppenheim suggestions. Pt agrees.

## 2019-06-30 ENCOUNTER — Encounter: Payer: Self-pay | Admitting: Gastroenterology

## 2019-07-01 ENCOUNTER — Ambulatory Visit: Payer: PRIVATE HEALTH INSURANCE | Admitting: Gastroenterology

## 2019-07-12 ENCOUNTER — Other Ambulatory Visit: Payer: Self-pay | Admitting: Family Medicine

## 2019-07-12 DIAGNOSIS — Z1231 Encounter for screening mammogram for malignant neoplasm of breast: Secondary | ICD-10-CM

## 2019-07-30 ENCOUNTER — Other Ambulatory Visit: Payer: Self-pay

## 2019-07-30 ENCOUNTER — Ambulatory Visit: Payer: PRIVATE HEALTH INSURANCE | Admitting: Gastroenterology

## 2019-07-30 VITALS — BP 106/70 | HR 128 | Temp 98.0°F | Ht 62.0 in | Wt 141.6 lb

## 2019-07-30 DIAGNOSIS — K529 Noninfective gastroenteritis and colitis, unspecified: Secondary | ICD-10-CM

## 2019-07-30 NOTE — Progress Notes (Signed)
Jonathon Bellows MD, MRCP(U.K) [address]  [place]  [place], [place] [address]  Main: [phone]  Fax: [phone]   Primary Care Physician: Hortencia Pilar, MD  Primary Gastroenterologist:  Dr. Jonathon Bellows   No chief complaint on file.   HPI: Natasha [name] is a 63 y.o. femalewas admitted to hospital on 06/10/2019 and seen by my colleague Dr. Allen Norris for colitis.  Patient has a history of C. difficile colitis.  Last colonoscopy was in June 2018.  And recalls had a polyp.  Was suggested to have repeat colonoscopy in 3 years.  On admission had abdominal pain and one bloody bowel movement.  CT scan on admission showed inflammatory changes of the distal transverse colon proximal descending and part of the sigmoid colon.  Differentials include infectious versus an ischemic or inflammatory colitis.  Impression at that point of time was it likely was an ischemic colitis.  Fecal lactoferrin was elevated.  At discharge she was given a course of Flagyl and cefdinir.  Since going home she has had no rectal bleeding but has had abdominal discomfort on the left side along with multiple bowel movements per day up to 4 a day.  Low energy levels.  Overall she does feel better but not completely back to normal.    Interval history   12/17/2026-07/30/2019  06/17/2019: CRP less than 1, hemoglobin 14.3, Doing well no complaints.  Denies any rectal bleeding.  No nausea no vomiting.  No abdominal pain.  Current Outpatient Medications  Medication Sig Dispense Refill  . albuterol (PROVENTIL HFA;VENTOLIN HFA) 108 (90 Base) MCG/ACT inhaler Inhale into the lungs every 6 (six) hours as needed for wheezing or shortness of breath.    Marland Kitchen albuterol (PROVENTIL) (2.5 MG/3ML) 0.083% nebulizer solution Take 2.5 mg by nebulization every 6 (six) hours as needed for wheezing.    . Calcium Citrate-Vitamin D (CALCIUM + D PO) Take 1 tablet by mouth daily.     . cefdinir (OMNICEF) 300 MG capsule Take 1 capsule (300 mg total)  by mouth every 12 (twelve) hours. 16 capsule 0  . cetirizine (ZYRTEC) 10 MG tablet Take 10 mg by mouth at bedtime.    . Docosanol 10 % CREA Apply 1 application topically 5 (five) times daily as needed.    Marland Kitchen ketoconazole (NIZORAL) 2 % shampoo Apply 1 application topically as needed for irritation (dry scalp).    . metroNIDAZOLE (FLAGYL) 500 MG tablet Take 1 tablet (500 mg total) by mouth every 8 (eight) hours. 24 tablet 0  . nystatin (MYCOSTATIN) 100000 UNIT/ML suspension Take 5 mLs (500,000 Units total) by mouth 4 (four) times daily. 60 mL 0  . Probiotic Product (PROBIOTIC DAILY PO) Take 1 capsule by mouth every morning. [place] OTC product    . Tiotropium Bromide Monohydrate (SPIRIVA RESPIMAT) 1.25 MCG/ACT AERS INHALE 2 PUFFS INTO LUNGS BY MOUTH ONCE DAILY     No current facility-administered medications for this visit.     Allergies as of 07/30/2019 - Review Complete 06/17/2019  Allergen Reaction Noted  . Codeine Rash and Hives 02/21/2016  . Ciprofloxacin Diarrhea 06/11/2019  . Hydrocodone-acetaminophen Nausea Only 03/31/2016    ROS:  General: Negative for anorexia, weight loss, fever, chills, fatigue, weakness. ENT: Negative for hoarseness, difficulty swallowing , nasal congestion. CV: Negative for chest pain, angina, palpitations, dyspnea on exertion, peripheral edema.  Respiratory: Negative for dyspnea at rest, dyspnea on exertion, cough, sputum, wheezing.  GI: See history of present illness. GU:  Negative for  dysuria, hematuria, urinary incontinence, urinary frequency, nocturnal urination.  Endo: Negative for unusual weight change.    Physical Examination:   There were no vitals taken for this visit.  General: Well-nourished, well-developed in no acute distress.  Eyes: No icterus. Conjunctivae pink. Mouth: Oropharyngeal mucosa moist and pink , no lesions erythema or exudate. Lungs: Clear to auscultation bilaterally. Non-labored. Heart: Regular rate and rhythm,  no murmurs rubs or gallops.  Abdomen: Bowel sounds are normal, nontender, nondistended, no hepatosplenomegaly or masses, no abdominal bruits or hernia , no rebound or guarding.   Extremities: No lower extremity edema. No clubbing or deformities. Neuro: Alert and oriented x 3.  Grossly intact. Skin: Warm and dry, no jaundice.   Psych: Alert and cooperative, normal mood and affect.   Imaging Studies: No results found.  Assessment and Plan:   Natasha [name] is a 63 y.o. y/o female here to follow-up for her hospital discharge.  She was admitted a week back and found to have left-sided colitis.  Negative for infection based on stool test.  Felt likely due to be from ischemic colitis.    All symptoms are resolved since last office visit.    Dr Jonathon Bellows  MD,MRCP [facility]) Follow up in as needed.

## 2019-08-13 ENCOUNTER — Ambulatory Visit
Admission: RE | Admit: 2019-08-13 | Discharge: 2019-08-13 | Disposition: A | Discharge status: Home / Self Care | Payer: PRIVATE HEALTH INSURANCE | Source: Ambulatory Visit | Attending: Family Medicine | Admitting: Family Medicine

## 2019-08-13 ENCOUNTER — Other Ambulatory Visit: Payer: Self-pay

## 2019-08-13 DIAGNOSIS — Z1231 Encounter for screening mammogram for malignant neoplasm of breast: Secondary | ICD-10-CM | POA: Insufficient documentation

## 2019-08-15 ENCOUNTER — Ambulatory Visit: Payer: PRIVATE HEALTH INSURANCE | Admitting: Gastroenterology

## 2019-08-15 ENCOUNTER — Encounter

## 2019-10-28 ENCOUNTER — Other Ambulatory Visit: Payer: Self-pay

## 2019-10-28 DIAGNOSIS — Z20822 Contact with and (suspected) exposure to covid-19: Secondary | ICD-10-CM

## 2019-10-28 DIAGNOSIS — Z8616 Personal history of COVID-19: Secondary | ICD-10-CM

## 2019-10-28 HISTORY — DX: Personal history of COVID-19: Z86.16

## 2019-10-30 LAB — NOVEL CORONAVIRUS, NAA: SARS-CoV-2, NAA: DETECTED — AB

## 2020-02-12 ENCOUNTER — Ambulatory Visit
Admission: EM | Admit: 2020-02-12 | Discharge: 2020-02-12 | Disposition: A | Discharge status: Home / Self Care | Payer: 59 | Attending: Emergency Medicine | Admitting: Emergency Medicine

## 2020-02-12 ENCOUNTER — Other Ambulatory Visit: Payer: Self-pay

## 2020-02-12 ENCOUNTER — Encounter: Payer: Self-pay | Admitting: Emergency Medicine

## 2020-02-12 DIAGNOSIS — W57XXXA Bitten or stung by nonvenomous insect and other nonvenomous arthropods, initial encounter: Secondary | ICD-10-CM | POA: Diagnosis not present

## 2020-02-12 DIAGNOSIS — S50862A Insect bite (nonvenomous) of left forearm, initial encounter: Secondary | ICD-10-CM

## 2020-02-12 MED ORDER — TRIAMCINOLONE ACETONIDE 0.025 % EX OINT: 1.0000 "application " | TOPICAL_OINTMENT | Freq: Two times a day (BID) | CUTANEOUS | 0 refills | Status: DC

## 2020-02-12 MED ORDER — IBUPROFEN 600 MG PO TABS: 600.0000 mg | ORAL_TABLET | Freq: Four times a day (QID) | ORAL | 0 refills | Status: DC | PRN

## 2020-02-12 MED ORDER — HYDROXYZINE HCL 25 MG PO TABS: 25.0000 mg | ORAL_TABLET | Freq: Four times a day (QID) | ORAL | 0 refills | Status: DC | PRN

## 2020-02-12 MED ORDER — SULFAMETHOXAZOLE-TRIMETHOPRIM 800-160 MG PO TABS: 1.0000 | ORAL_TABLET | Freq: Two times a day (BID) | ORAL | 0 refills | Status: AC

## 2020-02-12 NOTE — Discharge Instructions (Addendum)
Bactrim 1 tab twice a day for 5 days.  Ice the area for 20 minutes at a time for pain swelling and itching, 600 mg of ibuprofen combined with 1 g of Tylenol 3-4 times a day for pain, swelling, triamcinolone ointment for swelling, itching.  May try Claritin or Zyrtec, and if this does not work, Atarax/hydroxyzine for the itching.

## 2020-02-12 NOTE — ED Provider Notes (Addendum)
HPI  SUBJECTIVE:  Natasha [name] is a 64 y.o. female who presents with an erythematous swollen painful pruritic area on her left distal forearm.  States that she was working in her garden yesterday, and something "bit her".  Thinks it may have crawled underneath her watch wristband.  She describes the pain as sore, itchy, constant.  States that the erythema and swelling has gotten significantly bigger since last night.  No body aches or fevers.  She has tried running cold water over it with improvement in her symptoms.  She has also tried witch hazel, isopropyl alcohol and an old prescription of clobetasol cream.  Symptoms are worse with scratching it.  Past medical history negative for MRSA diabetes hypertension kidney disease, GI bleed.  She has a history of C. difficile diarrhea from Cipro.  Tetanus up-to-date.  PMD: Hortencia Pilar, MD   Past Medical History:  Diagnosis Date  . Acid reflux   . Cancer (HCC)    squamous cell and melanoma  . GERD (gastroesophageal reflux disease)   . Hyperlipidemia   . Osteoporosis   . Thrombophlebitis 1983  . Unspecified venous (peripheral) insufficiency     Past Surgical History:  Procedure Laterality Date  . CHOLECYSTECTOMY  2007  . COLONOSCOPY  2008  . LAPAROSCOPY  2007    Family History  Problem Relation Age of Onset  . Breast cancer Mother 25    Social History   Tobacco Use  . Smoking status: Current Every Day Smoker    Packs/day: 1.00    Years: 30.00    Pack years: 30.00  . Smokeless tobacco: Never Used  Substance Use Topics  . Alcohol use: Yes  . Drug use: No    No current facility-administered medications for this encounter.  Current Outpatient Medications:  .  albuterol (PROVENTIL HFA;VENTOLIN HFA) 108 (90 Base) MCG/ACT inhaler, Inhale into the lungs every 6 (six) hours as needed for wheezing or shortness of breath., Disp: , Rfl:  .  cetirizine (ZYRTEC) 10 MG tablet, Take 10 mg by mouth at bedtime., Disp: , Rfl:  .   Probiotic Product (PROBIOTIC DAILY PO), Take 1 capsule by mouth every morning. [place] OTC product, Disp: , Rfl:  .  Tiotropium Bromide Monohydrate (SPIRIVA RESPIMAT) 1.25 MCG/ACT AERS, INHALE 2 PUFFS INTO LUNGS BY MOUTH ONCE DAILY, Disp: , Rfl:  .  albuterol (PROVENTIL) (2.5 MG/3ML) 0.083% nebulizer solution, Take 2.5 mg by nebulization every 6 (six) hours as needed for wheezing., Disp: , Rfl:  .  hydrOXYzine (ATARAX/VISTARIL) 25 MG tablet, Take 1 tablet (25 mg total) by mouth every 6 (six) hours as needed for itching., Disp: 20 tablet, Rfl: 0 .  ibuprofen (ADVIL) 600 MG tablet, Take 1 tablet (600 mg total) by mouth every 6 (six) hours as needed., Disp: 30 tablet, Rfl: 0 .  sulfamethoxazole-trimethoprim (BACTRIM DS) 800-160 MG tablet, Take 1 tablet by mouth 2 (two) times daily for 5 days., Disp: 10 tablet, Rfl: 0 .  triamcinolone (KENALOG) 0.025 % ointment, Apply 1 application topically 2 (two) times daily., Disp: 30 g, Rfl: 0  Allergies  Allergen Reactions  . Codeine Rash and Hives  . Ciprofloxacin Diarrhea    C. diff  . Hydrocodone-Acetaminophen Nausea Only     ROS  As noted in HPI.   Physical Exam  BP 109/63 (BP Location: Right Arm)   Pulse 61   Temp 97.8 F (36.6 C) (Oral)   Resp 18   Ht 5\' 2"  (1.575 m)  Wt 63.5 kg   SpO2 96%   BMI 25.61 kg/m   Constitutional: Well developed, well nourished, no acute distress Eyes:  EOMI, conjunctiva normal bilaterally HENT: Normocephalic, atraumatic,mucus membranes moist Respiratory: Normal inspiratory effort Cardiovascular: Normal rate GI: nondistended skin: 9 x 7 cm tender area of blanchable erythema, swelling lateral left forearm. Skin intact.       Musculoskeletal: no deformities Neurologic: Alert & oriented x 3, no focal neuro deficits Psychiatric: Speech and behavior appropriate   ED Course   Medications - No data to display  No orders of the defined types were placed in this encounter.   No results  found for this or any previous visit (from the past 24 hour(s)). No results found.  ED Clinical Impression  1. Bug bite with infection, initial encounter      ED Assessment/Plan  Marked area of erythema with marker for reference.  Suspect allergic reaction to insect bite, with a possible secondary cellulitis.  Patient reports C. difficile diarrhea with Cipro and states that she was admitted to the hospital with doxycycline so will send home with Bactrim 1 tab twice a day for 5 days.  Ice the area for 20 minutes at a time, 600 mg of ibuprofen combined with 1 g of Tylenol 3-4 times a day, triamcinolone ointment.  May try Claritin or Zyrtec, and if this does not work, Atarax/hydroxyzine for the itching.  Follow-up with PMD as needed.  Return here or see PMD if it gets worse.  Discussed  MDM, treatment plan, and plan for follow-up with patient. patient agrees with plan.   Meds ordered this encounter  Medications  . ibuprofen (ADVIL) 600 MG tablet    Sig: Take 1 tablet (600 mg total) by mouth every 6 (six) hours as needed.    Dispense:  30 tablet    Refill:  0  . sulfamethoxazole-trimethoprim (BACTRIM DS) 800-160 MG tablet    Sig: Take 1 tablet by mouth 2 (two) times daily for 5 days.    Dispense:  10 tablet    Refill:  0  . hydrOXYzine (ATARAX/VISTARIL) 25 MG tablet    Sig: Take 1 tablet (25 mg total) by mouth every 6 (six) hours as needed for itching.    Dispense:  20 tablet    Refill:  0  . triamcinolone (KENALOG) 0.025 % ointment    Sig: Apply 1 application topically 2 (two) times daily.    Dispense:  30 g    Refill:  0    *This clinic note was created using [employer]. Therefore, there may be occasional mistakes despite careful proofreading.   ?    Melynda Ripple, MD 02/12/20 1230    Melynda Ripple, MD 02/12/20 1230

## 2020-02-12 NOTE — ED Triage Notes (Signed)
Patient states she was working in her garden yesterday and she states something bit her on her left arm. She has swelling and redness to her left arm.

## 2020-03-02 ENCOUNTER — Other Ambulatory Visit: Payer: Self-pay

## 2020-03-02 ENCOUNTER — Ambulatory Visit: Payer: 59 | Attending: Internal Medicine

## 2020-03-02 DIAGNOSIS — Z23 Encounter for immunization: Secondary | ICD-10-CM

## 2020-03-02 NOTE — Progress Notes (Signed)
   Covid-19 Vaccination Clinic  Name:  Natasha [name]    MRN: [id number] DOB: 03/04/1956  03/02/2020  [name] was observed post Covid-19 immunization for 15 minutes without incident. She was provided with Vaccine Information Sheet and instruction to access the V-Safe system.   [name] was instructed to call 911 with any severe reactions post vaccine: Marland Kitchen Difficulty breathing  . Swelling of face and throat  . A fast heartbeat  . A bad rash all over body  . Dizziness and weakness   Immunizations Administered    Name Date Dose VIS Date Route   Pfizer COVID-19 Vaccine 03/02/2020 11:14 AM 0.3 mL 11/15/2019 Intramuscular   Manufacturer: [employer]   Lot: [id number]   [place]: [id number]

## 2020-03-25 ENCOUNTER — Ambulatory Visit: Payer: 59 | Attending: Internal Medicine

## 2020-03-25 DIAGNOSIS — Z23 Encounter for immunization: Secondary | ICD-10-CM

## 2020-03-25 NOTE — Progress Notes (Signed)
   Covid-19 Vaccination Clinic  Name:  Natasha [name]    MRN: [id number] DOB: [birth year]  03/25/2020  [name] was observed post Covid-19 immunization for 15 minutes without incident. She was provided with Vaccine Information Sheet and instruction to access the V-Safe system.   [name] was instructed to call 911 with any severe reactions post vaccine: Marland Kitchen Difficulty breathing  . Swelling of face and throat  . A fast heartbeat  . A bad rash all over body  . Dizziness and weakness   Immunizations Administered    Name Date Dose VIS Date Route   Pfizer COVID-19 Vaccine 03/25/2020  1:27 PM 0.3 mL 01/29/2019 Intramuscular   Manufacturer: [employer], [employer]   Lot: [id number]   [place]: [id number]

## 2020-04-16 ENCOUNTER — Ambulatory Visit
Admission: EM | Admit: 2020-04-16 | Discharge: 2020-04-16 | Disposition: A | Discharge status: Home / Self Care | Payer: 59 | Attending: Urgent Care | Admitting: Urgent Care

## 2020-04-16 ENCOUNTER — Other Ambulatory Visit: Payer: Self-pay

## 2020-04-16 DIAGNOSIS — K2960 Other gastritis without bleeding: Secondary | ICD-10-CM | POA: Diagnosis not present

## 2020-04-16 DIAGNOSIS — R112 Nausea with vomiting, unspecified: Secondary | ICD-10-CM | POA: Diagnosis present

## 2020-04-16 DIAGNOSIS — T380X5A Adverse effect of glucocorticoids and synthetic analogues, initial encounter: Secondary | ICD-10-CM | POA: Insufficient documentation

## 2020-04-16 DIAGNOSIS — R197 Diarrhea, unspecified: Secondary | ICD-10-CM | POA: Insufficient documentation

## 2020-04-16 LAB — COMPREHENSIVE METABOLIC PANEL
ALT: 23 U/L (ref 0–44)
AST: 28 U/L (ref 15–41)
Albumin: 4.2 g/dL (ref 3.5–5.0)
Alkaline Phosphatase: 93 U/L (ref 38–126)
Anion gap: 8 (ref 5–15)
BUN: 21 mg/dL (ref 8–23)
CO2: 30 mmol/L (ref 22–32)
Calcium: 8.9 mg/dL (ref 8.9–10.3)
Chloride: 101 mmol/L (ref 98–111)
Creatinine, Ser: 0.81 mg/dL (ref 0.44–1.00)
GFR calc Af Amer: >60 mL/min (ref >60)
GFR calc non Af Amer: >60 mL/min (ref >60)
Glucose, Bld: 98 mg/dL (ref 70–99)
Potassium: 4.1 mmol/L (ref 3.5–5.1)
Sodium: 139 mmol/L (ref 135–145)
Total Bilirubin: 0.6 mg/dL (ref 0.3–1.2)
Total Protein: 7.4 g/dL (ref 6.5–8.1)

## 2020-04-16 LAB — TROPONIN I (HIGH SENSITIVITY): Troponin I (High Sensitivity): 3 ng/L (ref <18)

## 2020-04-16 LAB — CBC
HCT: 48.3 % — ABNORMAL HIGH (ref 36.0–46.0)
Hemoglobin: 16 g/dL — ABNORMAL HIGH (ref 12.0–15.0)
MCH: 28.9 pg (ref 26.0–34.0)
MCHC: 33.1 g/dL (ref 30.0–36.0)
MCV: 87.3 fL (ref 80.0–100.0)
Platelets: 241 10*3/uL (ref 150–400)
RBC: 5.53 MIL/uL — ABNORMAL HIGH (ref 3.87–5.11)
RDW: 12.6 % (ref 11.5–15.5)
WBC: 9.1 10*3/uL (ref 4.0–10.5)
nRBC: 0 % (ref 0.0–0.2)

## 2020-04-16 LAB — LIPASE, BLOOD: Lipase: 26 U/L (ref 11–51)

## 2020-04-16 MED ORDER — LOPERAMIDE HCL 2 MG PO CAPS: 2.0000 mg | ORAL_CAPSULE | Freq: Three times a day (TID) | ORAL | 0 refills | Status: DC | PRN

## 2020-04-16 MED ORDER — ONDANSETRON 8 MG PO TBDP
8.0000 mg | ORAL_TABLET | Freq: Once | ORAL | Status: AC
  Administered 2020-04-16: 8 mg via ORAL

## 2020-04-16 MED ORDER — ONDANSETRON 4 MG PO TBDP: 4.0000 mg | ORAL_TABLET | Freq: Three times a day (TID) | ORAL | 0 refills | Status: DC | PRN

## 2020-04-16 MED ORDER — SUCRALFATE 1 G PO TABS: 1.0000 g | ORAL_TABLET | Freq: Three times a day (TID) | ORAL | 0 refills | Status: DC

## 2020-04-16 MED ORDER — FAMOTIDINE 20 MG PO TABS: 20.0000 mg | ORAL_TABLET | Freq: Two times a day (BID) | ORAL | 0 refills | Status: DC

## 2020-04-16 NOTE — ED Provider Notes (Signed)
[place], [place]   Name: Natasha [name] DOB: March 06, 1956 MRN: [id number] CSN: [id number] PCP: Hortencia Pilar, MD  Arrival date and time:  04/16/20 1345  Chief Complaint:  Emesis and Diarrhea  NOTE: Prior to seeing the patient today, I have reviewed the triage nursing documentation and vital signs. Clinical staff has updated patient's PMH/PSHx, current medication list, and drug allergies/intolerances to ensure comprehensive history available to assist in medical decision making.   History:   HPI: Natasha [name] is a 64 y.o. female who presents today with complaints of acute onset nausea, vomiting, and diarrhea that began this morning. Patient denies any associated fevers/chills. She reports upper abdominal pain that she describes as a "soreness" or "burning" sensation. Appetite has been poor, however patient has been making efforts to remain hydrated. She is experiencing xerostomia. She denies recent illness; no on else in her home has similar symptoms. Patient had SARS-CoV-2 (novel coronavirus) back in 10/2019. She is currently on day 6 of systemic prednisone being used for pain in her RIGHT shoulder. Patient has been on 20 mg daily for the last week. Dose was up titrated to 30 mg yesterday. Patient reports 3-4 episodes of vomiting and diarrhea today. Despite her symptoms, patient has not taken any over the counter interventions to help improve/relieve her reported symptoms at home.    Past Medical History:  Diagnosis Date   Acid reflux    Cancer ([place])    squamous cell and melanoma   GERD (gastroesophageal reflux disease)    History of 2019 novel coronavirus disease (COVID-19) 10/28/2019   Hyperlipidemia    Osteoporosis    Thrombophlebitis 1983   Unspecified venous (peripheral) insufficiency     Past Surgical History:  Procedure Laterality Date   CHOLECYSTECTOMY  2007   COLONOSCOPY  2008   LAPAROSCOPY  2007    Family History  Problem Relation Age of Onset   Breast  cancer Mother 109    Social History   Tobacco Use   Smoking status: Current Every Day Smoker    Packs/day: 1.00    Years: 30.00    Pack years: 30.00   Smokeless tobacco: Never Used  Substance Use Topics   Alcohol use: Yes   Drug use: No    Patient Active Problem List   Diagnosis Date Noted   Ischemic colitis ([place])    Acute colitis 06/10/2019    Home Medications:    Current Meds  Medication Sig   albuterol (PROVENTIL HFA;VENTOLIN HFA) 108 (90 Base) MCG/ACT inhaler Inhale into the lungs every 6 (six) hours as needed for wheezing or shortness of breath.   cetirizine (ZYRTEC) 10 MG tablet Take 10 mg by mouth at bedtime.   hydrOXYzine (ATARAX/VISTARIL) 25 MG tablet Take 1 tablet (25 mg total) by mouth every 6 (six) hours as needed for itching.   ibuprofen (ADVIL) 600 MG tablet Take 1 tablet (600 mg total) by mouth every 6 (six) hours as needed.   predniSONE (DELTASONE) 20 MG tablet Take 40 mg by mouth daily.   traMADol (ULTRAM) 50 MG tablet Take 50 mg by mouth every 6 (six) hours as needed.   TRELEGY ELLIPTA 100-62.5-25 MCG/INH AEPB Inhale 1 puff into the lungs daily.   triamcinolone (KENALOG) 0.025 % ointment Apply 1 application topically 2 (two) times daily.    Allergies:   Codeine, Ciprofloxacin, and Hydrocodone-acetaminophen  Review of Systems (ROS):  Review of systems NEGATIVE unless otherwise noted in narrative H&P section.   Vital Signs: Today's Vitals  04/16/20 1408 04/16/20 1411 04/16/20 1545  BP:  105/70   Pulse:  72   Resp:  18   Temp:  98.3 F (36.8 C)   TempSrc:  Oral   SpO2:  97%   Weight: 140 lb (63.5 kg)    Height: 5\' 2"  (1.575 m)    PainSc: 0-No pain  0-No pain    Physical Exam: Physical Exam  Constitutional: She is oriented to person, place, and time. She appears distressed (2/2 acute nausea and vomiting).  Acutely ill appearing; fatigued.  HENT:  Head: Normocephalic and atraumatic.  Nose: Nose normal.  Mouth/Throat: Uvula is  midline, oropharynx is clear and moist and mucous membranes are normal.  Eyes: Pupils are equal, round, and reactive to light.  Cardiovascular: Normal rate, regular rhythm, normal heart sounds and intact distal pulses.  Pulmonary/Chest: Effort normal and breath sounds normal.  Abdominal: Soft. Normal appearance and bowel sounds are normal. She exhibits no distension. There is abdominal tenderness in the epigastric area. There is no CVA tenderness.  Musculoskeletal:     Cervical back: Normal range of motion and neck supple.  Neurological: She is alert and oriented to person, place, and time. Gait normal.  Skin: Skin is warm and dry. No rash noted. She is not diaphoretic.  Psychiatric: Memory, affect and judgment normal. Her mood appears anxious.  Nursing note and vitals reviewed.   Urgent Care Treatments / Results:   Orders Placed This Encounter  Procedures   CBC   Comprehensive metabolic panel   Lipase, blood   ED EKG   EKG 12-Lead    LABS: PLEASE NOTE: all labs that were ordered this encounter are listed, however only abnormal results are displayed. Labs Reviewed  CBC - Abnormal; Notable for the following components:      Result Value   RBC 5.53 (*)    Hemoglobin 16.0 (*)    HCT 48.3 (*)    All other components within normal limits  COMPREHENSIVE METABOLIC PANEL  LIPASE, BLOOD  TROPONIN I (HIGH SENSITIVITY)    URGENT CARE ECG REPORT Date: 04/16/2020 Time ECG obtained: 1506 PM Rate: 75 bpm Rhythm: SR with PACs Axis (leads I and aVF): rightward  Intervals: PR 140 ms. QTc 390 ms.  ST segment and T wave changes: No evidence of ST segment elevation or depression Comparison: Similar to previous tracing obtained on 06/11/2019.  RADIOLOGY: No results found.  PROCEDURES: Procedures  MEDICATIONS RECEIVED THIS VISIT: Medications  ondansetron (ZOFRAN-ODT) disintegrating tablet 8 mg (8 mg Oral Given 04/16/20 1526)    PERTINENT CLINICAL COURSE NOTES/UPDATES:     Initial Impression / Assessment and Plan / Urgent Care Course:  Pertinent labs & imaging results that were available during my care of the patient were personally reviewed by me and considered in my medical decision making (see lab/imaging section of note for values and interpretations).  Natasha [name] is a 64 y.o. female who presents to [facility] today with complaints of Emesis and Diarrhea  Patient acutely ill appearing (non-toxic) appearing in clinic today. She appears to be in mild distress due to her nausea (dry heaving). Presenting symptoms (see HPI) and exam as documented above. Exam reassuring. VSS; no orthostatic changes. EKG shows SR with PACs at a rate of 75 bpm. Labs (CBC, CMP, lipase, hs-TnI) normal. Patient on systemic steroids for over a week at this time. She has not been taking with food. PMH (+) for GERD. Suspect steroid induced gastritis. Steroids are not beneficial. Advised to  discontinue and contact prescribing provider to discuss alternatives. Discussed that steroids cause gastric irritation and increased anxiety, thus leading to the symptoms that she is experiencing. Treated with ondansetron 8 mg ODT in clinic. Sending home on famotidine and Carafate for the gastritis, ondansetron for the nausea and vomiting, and loperamide for the diarrhea. Reviewed supportive care measures; rest, and increased hydration (water + ORS).   Discussed follow up with primary care physician in 1 week for re-evaluation. I have reviewed the follow up and strict return precautions for any new or worsening symptoms. Patient is aware of symptoms that would be deemed urgent/emergent, and would thus require further evaluation either here or in the emergency department. At the time of discharge, she verbalized understanding and consent with the discharge plan as it was reviewed with her. All questions were fielded by provider and/or clinic staff prior to patient discharge.    Final Clinical Impressions  / Urgent Care Diagnoses:   Final diagnoses:  Steroid-induced gastritis  Nausea vomiting and diarrhea    New Prescriptions:  [place] Controlled Substance Registry consulted? Not Applicable  Meds ordered this encounter  Medications   famotidine (PEPCID) 20 MG tablet    Sig: Take 1 tablet (20 mg total) by mouth 2 (two) times daily.    Dispense:  30 tablet    Refill:  0   sucralfate (CARAFATE) 1 g tablet    Sig: Take 1 tablet (1 g total) by mouth 4 (four) times daily -  with meals and at bedtime.    Dispense:  120 tablet    Refill:  0   ondansetron (ZOFRAN-ODT) 4 MG disintegrating tablet    Sig: Take 1 tablet (4 mg total) by mouth every 8 (eight) hours as needed.    Dispense:  15 tablet    Refill:  0   loperamide (IMODIUM) 2 MG capsule    Sig: Take 1 capsule (2 mg total) by mouth 3 (three) times daily as needed for diarrhea or loose stools.    Dispense:  15 capsule    Refill:  0   ondansetron (ZOFRAN-ODT) disintegrating tablet 8 mg    Recommended Follow up Care:  Patient encouraged to follow up with the following provider within the specified time frame, or sooner as dictated by the severity of her symptoms. As always, she was instructed that for any urgent/emergent care needs, she should seek care either here or in the emergency department for more immediate evaluation.  Follow-up Information    Hortencia Pilar, MD In 1 week.   Specialty: Family Medicine Why: General reassessment of symptoms if not improving Contact information: [place] [address] [phone]         NOTE: This note was prepared using Dragon dictation software along with smaller phrase technology. Despite my best ability to proofread, there is the potential that transcriptional errors may still occur from this process, and are completely unintentional.    Karen Kitchens, NP 04/16/20 2244

## 2020-04-16 NOTE — Discharge Instructions (Addendum)
It was very nice seeing you today in clinic. Thank you for entrusting me with your care.   Rest and Stay HYDRATED. Water and electrolyte containing beverages (Gatorade, Pedialyte) are best to prevent dehydration and electrolyte abnormalities. Advance your diet slowly. Please utilize the medications that we discussed. Your prescriptions has been called in to your pharmacy.   Make arrangements to follow up with your regular doctor in 1 week for re-evaluation if not improving. If your symptoms/condition worsens, please seek follow up care either here or in the ER. Please remember, our [place] providers are "right here with you" when you need [place].   Again, it was my pleasure to take care of you today. Thank you for choosing our clinic. I hope that you start to feel better quickly.   Honor Loh, MSN, APRN, FNP-C, CEN Advanced Practice Provider [facility]

## 2020-04-16 NOTE — ED Triage Notes (Addendum)
Pt presents with c/o n/v/d since waking up this morning. Pt reports she started an increased dose of prednisone yesterday, 30mg . She also took some Tramadol yesterday. Pt reports slight abdominal pain when nauseous. Pt denies any unusual foods. Pt denies fever/chills, cough, nasal congestion or other symptoms. Pt reports extremely dry mouth after vomiting.

## 2020-05-12 ENCOUNTER — Other Ambulatory Visit: Payer: Self-pay | Admitting: Physician Assistant

## 2020-05-12 DIAGNOSIS — M67911 Unspecified disorder of synovium and tendon, right shoulder: Secondary | ICD-10-CM

## 2020-05-15 ENCOUNTER — Ambulatory Visit
Admission: RE | Admit: 2020-05-15 | Discharge: 2020-05-15 | Disposition: A | Discharge status: Home / Self Care | Payer: 59 | Source: Ambulatory Visit | Attending: Physician Assistant | Admitting: Physician Assistant

## 2020-05-15 ENCOUNTER — Other Ambulatory Visit: Payer: Self-pay

## 2020-05-15 DIAGNOSIS — M67911 Unspecified disorder of synovium and tendon, right shoulder: Secondary | ICD-10-CM | POA: Insufficient documentation

## 2020-07-07 ENCOUNTER — Other Ambulatory Visit: Payer: Self-pay | Admitting: Family Medicine

## 2020-07-07 DIAGNOSIS — Z780 Asymptomatic menopausal state: Secondary | ICD-10-CM

## 2020-07-28 ENCOUNTER — Other Ambulatory Visit: Payer: Self-pay | Admitting: Orthopedic Surgery

## 2020-07-28 ENCOUNTER — Other Ambulatory Visit: Payer: Self-pay | Admitting: Family Medicine

## 2020-07-28 DIAGNOSIS — Z1231 Encounter for screening mammogram for malignant neoplasm of breast: Secondary | ICD-10-CM

## 2020-08-13 ENCOUNTER — Other Ambulatory Visit: Payer: Self-pay

## 2020-08-13 ENCOUNTER — Ambulatory Visit
Admission: RE | Admit: 2020-08-13 | Discharge: 2020-08-13 | Disposition: A | Discharge status: Home / Self Care | Payer: 59 | Source: Ambulatory Visit | Attending: Family Medicine | Admitting: Family Medicine

## 2020-08-13 DIAGNOSIS — Z780 Asymptomatic menopausal state: Secondary | ICD-10-CM | POA: Insufficient documentation

## 2020-08-13 DIAGNOSIS — Z1231 Encounter for screening mammogram for malignant neoplasm of breast: Secondary | ICD-10-CM | POA: Diagnosis present

## 2020-09-30 ENCOUNTER — Other Ambulatory Visit: Payer: Self-pay | Admitting: Family Medicine

## 2020-09-30 ENCOUNTER — Other Ambulatory Visit: Payer: Self-pay

## 2020-09-30 ENCOUNTER — Ambulatory Visit
Admission: RE | Admit: 2020-09-30 | Discharge: 2020-09-30 | Disposition: A | Discharge status: Home / Self Care | Payer: 59 | Source: Ambulatory Visit | Attending: Family Medicine | Admitting: Family Medicine

## 2020-09-30 DIAGNOSIS — R1032 Left lower quadrant pain: Secondary | ICD-10-CM

## 2020-09-30 DIAGNOSIS — K5792 Diverticulitis of intestine, part unspecified, without perforation or abscess without bleeding: Secondary | ICD-10-CM | POA: Insufficient documentation

## 2020-09-30 MED ORDER — IOHEXOL 300 MG/ML  SOLN
100.0000 mL | Freq: Once | INTRAMUSCULAR | Status: AC | PRN
  Administered 2020-09-30: 100 mL via INTRAVENOUS

## 2020-10-14 ENCOUNTER — Other Ambulatory Visit: Payer: Self-pay

## 2020-10-14 ENCOUNTER — Ambulatory Visit (INDEPENDENT_AMBULATORY_CARE_PROVIDER_SITE_OTHER): Payer: 59 | Admitting: Gastroenterology

## 2020-10-14 VITALS — BP 107/71 | HR 88 | Temp 97.8°F | Ht 62.0 in | Wt 136.0 lb

## 2020-10-14 DIAGNOSIS — R194 Change in bowel habit: Secondary | ICD-10-CM

## 2020-10-14 DIAGNOSIS — R1310 Dysphagia, unspecified: Secondary | ICD-10-CM | POA: Diagnosis not present

## 2020-10-14 MED ORDER — NA SULFATE-K SULFATE-MG SULF 17.5-3.13-1.6 GM/177ML PO SOLN
1.0000 | Freq: Once | ORAL | 0 refills | Status: AC
Start: 2020-10-14 — End: 2020-10-14

## 2020-10-14 MED ORDER — OMEPRAZOLE 40 MG PO CPDR
40.0000 mg | DELAYED_RELEASE_CAPSULE | Freq: Every day | ORAL | 1 refills | Status: AC
Start: 2020-10-14 — End: ?

## 2020-10-14 NOTE — Progress Notes (Signed)
Jonathon Bellows MD, MRCP(U.K) [address]  [place]  [place], [place] [address]  Main: [phone]  Fax: [phone]   Primary Care Physician: Hortencia Pilar, MD  Primary Gastroenterologist:  Dr. Jonathon Bellows    Left upper quadrant pain  HPI: Natasha [name] is a 64 y.o. female   Summary of history :   She was last seen in my office on 07/30/2019.  History of C. difficile colitis.  Admitted to the hospital in July 2020 for abdominal pain and one bloody bowel movement.CT scan on admission showed inflammatory changes of the distal transverse colon proximal descending and part of the sigmoid colon. Differentials include infectious versus an ischemic or inflammatory colitis. Impression at that point of time was it likely was an ischemic colitis. Fecal lactoferrin was elevated.At discharge she was given a course of Flagyl and cefdinir.  After going home the bowel movement normalized.   Interval history   07/30/2019-10/14/2020  09/30/2020: CT scan of the abdomen and pelvis with contrast showed rectosigmoid diverticulosis without any acute inflammation. 09/18/2020: CMP normal, hemoglobin 14.5 g.  Being evaluated by pulmonary for persistent cough.  She states that she has had left upper quadrant and lower quadrant pain for the past few weeks.  On and off.  She also suffers for constipation followed by diarrhea.  More often it is constipation.  Denies taking any Imodium.  Denies any NSAID use.  Denies any rectal bleeding.  Also complains of occasional dysphagia.  Ongoing for more than a year.  Takes PPI as needed.  Denies any heartburn.  Denies any weight loss.  Current Outpatient Medications  Medication Sig Dispense Refill  . albuterol (PROVENTIL HFA;VENTOLIN HFA) 108 (90 Base) MCG/ACT inhaler Inhale into the lungs every 6 (six) hours as needed for wheezing or shortness of breath.    Marland Kitchen albuterol (PROVENTIL) (2.5 MG/3ML) 0.083% nebulizer solution Take 2.5 mg by nebulization every 6  (six) hours as needed for wheezing.    . cetirizine (ZYRTEC) 10 MG tablet Take 10 mg by mouth at bedtime.    . famotidine (PEPCID) 20 MG tablet Take 1 tablet (20 mg total) by mouth 2 (two) times daily. 30 tablet 0  . hydrOXYzine (ATARAX/VISTARIL) 25 MG tablet Take 1 tablet (25 mg total) by mouth every 6 (six) hours as needed for itching. 20 tablet 0  . ibuprofen (ADVIL) 600 MG tablet Take 1 tablet (600 mg total) by mouth every 6 (six) hours as needed. 30 tablet 0  . loperamide (IMODIUM) 2 MG capsule Take 1 capsule (2 mg total) by mouth 3 (three) times daily as needed for diarrhea or loose stools. 15 capsule 0  . ondansetron (ZOFRAN-ODT) 4 MG disintegrating tablet Take 1 tablet (4 mg total) by mouth every 8 (eight) hours as needed. 15 tablet 0  . predniSONE (DELTASONE) 20 MG tablet Take 40 mg by mouth daily.    . sucralfate (CARAFATE) 1 g tablet Take 1 tablet (1 g total) by mouth 4 (four) times daily -  with meals and at bedtime. 120 tablet 0  . traMADol (ULTRAM) 50 MG tablet Take 50 mg by mouth every 6 (six) hours as needed.    . TRELEGY ELLIPTA 100-62.5-25 MCG/INH AEPB Inhale 1 puff into the lungs daily.    Marland Kitchen triamcinolone (KENALOG) 0.025 % ointment Apply 1 application topically 2 (two) times daily. 30 g 0   No current facility-administered medications for this visit.    Allergies as of 10/14/2020 - Review Complete 10/14/2020  Allergen  Reaction Noted  . Codeine Rash and Hives 02/21/2016  . Ciprofloxacin Diarrhea 06/11/2019  . Hydrocodone-acetaminophen Nausea Only 03/31/2016    ROS:  General: Negative for anorexia, weight loss, fever, chills, fatigue, weakness. ENT: Negative for hoarseness, difficulty swallowing , nasal congestion. CV: Negative for chest pain, angina, palpitations, dyspnea on exertion, peripheral edema.  Respiratory: Negative for dyspnea at rest, dyspnea on exertion, cough, sputum, wheezing.  GI: See history of present illness. GU:  Negative for dysuria, hematuria,  urinary incontinence, urinary frequency, nocturnal urination.  Endo: Negative for unusual weight change.    Physical Examination:   BP 107/71   Pulse 88   Temp 97.8 F (36.6 C)   Ht 5\' 2"  (1.575 m)   Wt 136 lb (61.7 kg)   BMI 24.87 kg/m   General: Well-nourished, well-developed in no acute distress.  Eyes: No icterus. Conjunctivae pink. Mouth: Oropharyngeal mucosa moist and pink , no lesions erythema or exudate. Lungs: Clear to auscultation bilaterally. Non-labored. Heart: Regular rate and rhythm, no murmurs rubs or gallops.  Abdomen: Bowel sounds are normal, nontender, nondistended, no hepatosplenomegaly or masses, no abdominal bruits or hernia , no rebound or guarding.   Extremities: No lower extremity edema. No clubbing or deformities. Neuro: Alert and oriented x 3.  Grossly intact. Skin: Warm and dry, no jaundice.   Psych: Alert and cooperative, normal mood and affect.   Imaging Studies: CT ABDOMEN PELVIS W CONTRAST  Result Date: 09/30/2020 CLINICAL DATA:  Left lower quadrant pain x4 weeks EXAM: CT ABDOMEN AND PELVIS WITH CONTRAST TECHNIQUE: Multidetector CT imaging of the abdomen and pelvis was performed using the standard protocol following bolus administration of intravenous contrast. CONTRAST:  171mL OMNIPAQUE IOHEXOL 300 MG/ML  SOLN COMPARISON:  CT dated June 10, 2019 FINDINGS: Lower chest: There are emphysematous changes at the lung bases.The heart size is normal. Hepatobiliary: The liver is normal. Status post cholecystectomy.There is no biliary ductal dilation. Pancreas: Normal contours without ductal dilatation. No peripancreatic fluid collection. Spleen: Unremarkable. Adrenals/Urinary Tract: --Adrenal glands: Unremarkable. --Right kidney/ureter: No hydronephrosis or radiopaque kidney stones. --Left kidney/ureter: No hydronephrosis or radiopaque kidney stones. --Urinary bladder: Unremarkable. Stomach/Bowel: --Stomach/Duodenum: No hiatal hernia or other gastric abnormality.  Normal duodenal course and caliber. --Small bowel: Unremarkable. --Colon: Rectosigmoid diverticulosis without acute inflammation. --Appendix: Normal. Vascular/Lymphatic: Atherosclerotic calcification is present within the non-aneurysmal abdominal aorta, without hemodynamically significant stenosis. --No retroperitoneal lymphadenopathy. --No mesenteric lymphadenopathy. --No pelvic or inguinal lymphadenopathy. Reproductive: Unremarkable Other: No ascites or free air. The abdominal wall is normal. Musculoskeletal. No acute displaced fractures. IMPRESSION: Rectosigmoid diverticulosis without acute inflammation. Aortic Atherosclerosis (ICD10-I70.0) and Emphysema (ICD10-J43.9). Electronically Signed   By: Constance Holster M.D.   On: 09/30/2020 18:20    Assessment and Plan:   Natasha [name] is a 64 y.o. y/o female here to see me for abdominal pain and dysphagia.  Based on her history of abdominal pain is suggestive of IBS constipation.  Dysphagia could be due to reflux but in view of her age we'll proceed with upper endoscopy.  Plan 1.  MiraLAX 1 capful daily for IBS constipation.  Due to change in bowel habits will proceed with colonoscopy. 2.  Dysphagia we'll proceed with EGD and commence on Prilosec to empirically treat any acid reflux.  Follow-up in 4 to 6 weeks  I have discussed alternative options, risks & benefits,  which include, but are not limited to, bleeding, infection, perforation,respiratory complication & drug reaction.  The patient agrees with this plan & written consent will be  obtained.       Dr Jonathon Bellows  MD,MRCP [facility]) Follow up in 4 to 6 weeks

## 2020-10-16 ENCOUNTER — Other Ambulatory Visit
Admission: RE | Admit: 2020-10-16 | Discharge: 2020-10-16 | Disposition: A | Discharge status: Home / Self Care | Payer: 59 | Source: Ambulatory Visit | Attending: Gastroenterology | Admitting: Gastroenterology

## 2020-10-16 ENCOUNTER — Other Ambulatory Visit: Payer: Self-pay

## 2020-10-16 DIAGNOSIS — Z20822 Contact with and (suspected) exposure to covid-19: Secondary | ICD-10-CM | POA: Insufficient documentation

## 2020-10-16 DIAGNOSIS — Z01812 Encounter for preprocedural laboratory examination: Secondary | ICD-10-CM | POA: Insufficient documentation

## 2020-10-17 LAB — SARS CORONAVIRUS 2 (TAT 6-24 HRS): SARS Coronavirus 2: NEGATIVE

## 2020-10-19 ENCOUNTER — Telehealth: Payer: Self-pay

## 2020-10-19 NOTE — Telephone Encounter (Signed)
Yes she should take zofran- advise her to update [place] tomorrow how she is doing

## 2020-10-20 ENCOUNTER — Telehealth: Payer: Self-pay | Admitting: Gastroenterology

## 2020-10-22 ENCOUNTER — Other Ambulatory Visit: Payer: Self-pay

## 2020-10-22 ENCOUNTER — Ambulatory Visit
Admission: EM | Admit: 2020-10-22 | Discharge: 2020-10-22 | Disposition: A | Discharge status: Home / Self Care | Payer: 59 | Attending: Physician Assistant | Admitting: Physician Assistant

## 2020-10-22 DIAGNOSIS — R630 Anorexia: Secondary | ICD-10-CM | POA: Diagnosis present

## 2020-10-22 DIAGNOSIS — R197 Diarrhea, unspecified: Secondary | ICD-10-CM | POA: Diagnosis present

## 2020-10-22 DIAGNOSIS — R5383 Other fatigue: Secondary | ICD-10-CM | POA: Diagnosis present

## 2020-10-22 LAB — COMPREHENSIVE METABOLIC PANEL
ALT: 20 U/L (ref 0–44)
AST: 23 U/L (ref 15–41)
Albumin: 4.1 g/dL (ref 3.5–5.0)
Alkaline Phosphatase: 98 U/L (ref 38–126)
Anion gap: 10 (ref 5–15)
BUN: 13 mg/dL (ref 8–23)
CO2: 30 mmol/L (ref 22–32)
Calcium: 9.3 mg/dL (ref 8.9–10.3)
Chloride: 98 mmol/L (ref 98–111)
Creatinine, Ser: 0.89 mg/dL (ref 0.44–1.00)
GFR, Estimated: >60 mL/min (ref >60)
Glucose, Bld: 134 mg/dL — ABNORMAL HIGH (ref 70–99)
Potassium: 4.6 mmol/L (ref 3.5–5.1)
Sodium: 138 mmol/L (ref 135–145)
Total Bilirubin: 0.3 mg/dL (ref 0.3–1.2)
Total Protein: 7.4 g/dL (ref 6.5–8.1)

## 2020-10-22 LAB — URINALYSIS, COMPLETE (UACMP) WITH MICROSCOPIC
Bacteria, UA: NONE SEEN
Bilirubin Urine: NEGATIVE
Glucose, UA: NEGATIVE mg/dL
Hgb urine dipstick: NEGATIVE
Ketones, ur: NEGATIVE mg/dL
Leukocytes,Ua: NEGATIVE
Nitrite: NEGATIVE
Protein, ur: NEGATIVE mg/dL
RBC / HPF: NONE SEEN RBC/hpf (ref 0–5)
Specific Gravity, Urine: 1.015 (ref 1.005–1.030)
pH: 6 (ref 5.0–8.0)

## 2020-10-22 LAB — CBC WITH DIFFERENTIAL/PLATELET
Abs Immature Granulocytes: 0.02 10*3/uL (ref 0.00–0.07)
Basophils Absolute: 0 10*3/uL (ref 0.0–0.1)
Basophils Relative: 0 %
Eosinophils Absolute: 0.1 10*3/uL (ref 0.0–0.5)
Eosinophils Relative: 1 %
HCT: 45.9 % (ref 36.0–46.0)
Hemoglobin: 15.1 g/dL — ABNORMAL HIGH (ref 12.0–15.0)
Immature Granulocytes: 0 %
Lymphocytes Relative: 27 %
Lymphs Abs: 1.7 10*3/uL (ref 0.7–4.0)
MCH: 29.3 pg (ref 26.0–34.0)
MCHC: 32.9 g/dL (ref 30.0–36.0)
MCV: 89.1 fL (ref 80.0–100.0)
Monocytes Absolute: 0.5 10*3/uL (ref 0.1–1.0)
Monocytes Relative: 8 %
Neutro Abs: 4 10*3/uL (ref 1.7–7.7)
Neutrophils Relative %: 64 %
Platelets: 275 10*3/uL (ref 150–400)
RBC: 5.15 MIL/uL — ABNORMAL HIGH (ref 3.87–5.11)
RDW: 12 % (ref 11.5–15.5)
WBC: 6.3 10*3/uL (ref 4.0–10.5)
nRBC: 0 % (ref 0.0–0.2)

## 2020-10-22 MED ORDER — SODIUM CHLORIDE 0.9 % IV BOLUS
1000.0000 mL | Freq: Once | INTRAVENOUS | Status: AC
  Administered 2020-10-22: 1000 mL via INTRAVENOUS

## 2020-10-22 NOTE — ED Triage Notes (Signed)
Pt states loss of appetite since October. States over past few weeks the sight of food makes her sick. Has had loose stools and then constipation since early November and saw GI who started her on Miralax and scheduled colonoscopy. Was supposed to be on Nov 16th but had nausea so it was cancelled.

## 2020-10-22 NOTE — Discharge Instructions (Addendum)
All of your labs look good today.  You have been given a bag of fluids.  Stop taking MiraLAX.  I suspect your diarrhea is due to taking the MiraLAX.  Increase your fluid intake.  If you feel that your mouth is getting dry keep a humidifier in your house.  Can also suck on sour candies.  We have sent you home with containers to obtain stool samples.  Bring those back at your earliest convenience and we will call you with the results within a couple days of the drop-off.  Follow-up with your GI specialist to continue to investigate the cause of your decreased appetite and nausea.  Try to incorporate more protein into your diet.  I would advise lean meats such as chicken and [place] or fish, nuts, protein shakes  Follow-up with our department as needed.  Go to emergency department if you are feeling really weak, running a fever, having dark stools or bloody stools, having excessive diarrhea and/or vomiting, having breathing difficulty or any other new acute worsening of her symptoms.

## 2020-10-22 NOTE — ED Provider Notes (Signed)
[facility]    CSN: [id number] Arrival date & time: 10/22/20  1518      History   Chief Complaint Chief Complaint  Patient presents with  . Anorexia    HPI Natasha [name] is a 64 y.o. female presenting for 1 week history of feeling dehydrated.  She states that she has had a decreased appetite for the past month to month and a half.  He says that the thought of food and trying to eat makes her nauseous.  Denies any vomiting.  Patient states that she was supposed to have a colonoscopy 2 days ago but had to cancel it because she felt too nauseous to make.  She says she has been having loose stools at times and then constipation other times for the past 2 to 3 weeks.  Patient does admit to taking MiraLAX prior to the colonoscopy.  She states that she has been having 4-5 episodes of watery diarrhea per day since starting MiraLAX.  Denies any dark stools or blood in the stool.  Patient states she is concerned for C. difficile because she is have this in the past.  Patient does follow-up with GI for her main complaint of decreased appetite and nausea.  Patient denies any fever.  She does admit to some fatigue.  Denies any dizziness, chest pain, shortness of breath, cough, abdominal pain, dysuria, hematuria, back pain.  Patient says she has lost about 5 to 7 pounds in the past month and a half.  She is she has been eating but only small amounts of bland food.  She states she has been trying to drink Pedialyte and increase her water intake but continues to feel fatigued and dehydrated.  Other past medical history significant for GERD, ischemic colitis, hyperlipidemia and skin cancer.  Patient also has history of COPD and uses Spiriva inhaler.  She does still smoke.  Patient does not have any other concerns today.  HPI  Past Medical History:  Diagnosis Date  . Acid reflux   . Cancer (HCC)    squamous cell and melanoma  . GERD (gastroesophageal reflux disease)   . History of 2019  novel coronavirus disease (COVID-19) 10/28/2019  . Hyperlipidemia   . Osteoporosis   . Thrombophlebitis 1983  . Unspecified venous (peripheral) insufficiency     Patient Active Problem List   Diagnosis Date Noted  . Ischemic colitis ([place])   . Acute colitis 06/10/2019    Past Surgical History:  Procedure Laterality Date  . CHOLECYSTECTOMY  2007  . COLONOSCOPY  2008  . LAPAROSCOPY  2007    OB History   No obstetric history on file.      Home Medications    Prior to Admission medications   Medication Sig Start Date End Date Taking? Authorizing Provider  polyethylene glycol (MIRALAX / GLYCOLAX) 17 g packet Take 17 g by mouth daily.   Yes [provider]  Tiotropium Bromide Monohydrate (SPIRIVA RESPIMAT) 1.25 MCG/ACT AERS Inhale into the lungs.   Yes [provider]  albuterol (PROVENTIL HFA;VENTOLIN HFA) 108 (90 Base) MCG/ACT inhaler Inhale into the lungs every 6 (six) hours as needed for wheezing or shortness of breath.    [provider]  albuterol (PROVENTIL) (2.5 MG/3ML) 0.083% nebulizer solution Take 2.5 mg by nebulization every 6 (six) hours as needed for wheezing.    [provider]  cetirizine (ZYRTEC) 10 MG tablet Take 10 mg by mouth at bedtime.    [provider]  famotidine (  PEPCID) 20 MG tablet Take 1 tablet (20 mg total) by mouth 2 (two) times daily. 04/16/20   Karen Kitchens, NP  hydrOXYzine (ATARAX/VISTARIL) 25 MG tablet Take 1 tablet (25 mg total) by mouth every 6 (six) hours as needed for itching. 02/12/20   Melynda Ripple, MD  ibuprofen (ADVIL) 600 MG tablet Take 1 tablet (600 mg total) by mouth every 6 (six) hours as needed. 02/12/20   Melynda Ripple, MD  loperamide (IMODIUM) 2 MG capsule Take 1 capsule (2 mg total) by mouth 3 (three) times daily as needed for diarrhea or loose stools. 04/16/20   Karen Kitchens, NP  omeprazole (PRILOSEC) 40 MG capsule Take 1 capsule (40 mg total) by mouth daily. 10/14/20   Jonathon Bellows,  MD  ondansetron (ZOFRAN-ODT) 4 MG disintegrating tablet Take 1 tablet (4 mg total) by mouth every 8 (eight) hours as needed. 04/16/20   Karen Kitchens, NP  predniSONE (DELTASONE) 20 MG tablet Take 40 mg by mouth daily. 04/10/20   [provider]  sucralfate (CARAFATE) 1 g tablet Take 1 tablet (1 g total) by mouth 4 (four) times daily -  with meals and at bedtime. 04/16/20   Karen Kitchens, NP  traMADol (ULTRAM) 50 MG tablet Take 50 mg by mouth every 6 (six) hours as needed. 04/10/20   [provider]  TRELEGY ELLIPTA 100-62.5-25 MCG/INH AEPB Inhale 1 puff into the lungs daily. 04/02/20   [provider]  triamcinolone (KENALOG) 0.025 % ointment Apply 1 application topically 2 (two) times daily. 02/12/20   Melynda Ripple, MD  Calcium Citrate-Vitamin D (CALCIUM + D PO) Take 1 tablet by mouth daily.   02/12/20  [provider]    Family History Family History  Problem Relation Age of Onset  . Breast cancer Mother 47    Social History Social History   Tobacco Use  . Smoking status: Current Every Day Smoker    Packs/day: 1.00    Years: 30.00    Pack years: 30.00  . Smokeless tobacco: Never Used  Vaping Use  . Vaping Use: Never used  Substance Use Topics  . Alcohol use: Yes  . Drug use: No     Allergies   Codeine, Ciprofloxacin, and Hydrocodone-acetaminophen   Review of Systems Review of Systems  Constitutional: Positive for appetite change, fatigue and unexpected weight change. Negative for chills, diaphoresis and fever.  HENT: Negative for congestion, ear pain, rhinorrhea and sore throat.   Respiratory: Negative for cough, shortness of breath and wheezing.   Cardiovascular: Negative for chest pain.  Gastrointestinal: Positive for diarrhea. Negative for abdominal pain, anal bleeding, blood in stool, nausea and vomiting.  Genitourinary: Negative for decreased urine volume and dysuria.  Musculoskeletal: Negative for arthralgias and myalgias.  Skin:  Negative for rash.  Neurological: Positive for weakness. Negative for headaches.  Hematological: Negative for adenopathy.     Physical Exam Triage Vital Signs ED Triage Vitals  Enc Vitals Group     BP 10/22/20 1531 116/86     Pulse Rate 10/22/20 1531 97     Resp 10/22/20 1531 20     Temp 10/22/20 1531 98.3 F (36.8 C)     Temp Source 10/22/20 1531 Oral     SpO2 10/22/20 1531 94 %     Weight 10/22/20 1534 135 lb 12.9 oz (61.6 kg)     Height 10/22/20 1534 5\' 2"  (1.575 m)     Head Circumference --      Peak Flow --  Pain Score 10/22/20 1534 3     Pain Loc --      Pain Edu? --      Excl. in [place]? --    No data found.  Updated Vital Signs BP 116/86 (BP Location: Right Arm)   Pulse 97   Temp 98.3 F (36.8 C) (Oral)   Resp 20   Ht 5\' 2"  (1.575 m)   Wt 135 lb 12.9 oz (61.6 kg)   SpO2 94%   BMI 24.84 kg/m       Physical Exam Vitals and nursing note reviewed.  Constitutional:      General: She is not in acute distress.    Appearance: Normal appearance. She is not ill-appearing or toxic-appearing.  HENT:     Head: Normocephalic and atraumatic.     Nose: Nose normal.     Mouth/Throat:     Mouth: Mucous membranes are moist.     Pharynx: Oropharynx is clear.  Eyes:     General: No scleral icterus.       Right eye: No discharge.        Left eye: No discharge.     Conjunctiva/sclera: Conjunctivae normal.  Cardiovascular:     Rate and Rhythm: Normal rate and regular rhythm.     Heart sounds: Normal heart sounds.  Pulmonary:     Effort: Pulmonary effort is normal. No respiratory distress.     Breath sounds: Normal breath sounds. No wheezing, rhonchi or rales.     Comments: Mildly increased respiratory rate Abdominal:     Palpations: Abdomen is soft.     Tenderness: There is no abdominal tenderness. There is no guarding.  Musculoskeletal:     Cervical back: Neck supple.  Skin:    General: Skin is dry.  Neurological:     General: No focal deficit present.      Mental Status: She is alert. Mental status is at baseline.     Motor: No weakness.     Gait: Gait normal.  Psychiatric:        Mood and Affect: Mood normal.        Behavior: Behavior normal.        Thought Content: Thought content normal.      UC Treatments / Results  Labs (all labs ordered are listed, but only abnormal results are displayed) Labs Reviewed  CBC WITH DIFFERENTIAL/PLATELET - Abnormal; Notable for the following components:      Result Value   RBC 5.15 (*)    Hemoglobin 15.1 (*)    All other components within normal limits  COMPREHENSIVE METABOLIC PANEL - Abnormal; Notable for the following components:   Glucose, Bld 134 (*)    All other components within normal limits  URINALYSIS, COMPLETE (UACMP) WITH MICROSCOPIC    EKG   Radiology No results found.  Procedures Procedures (including critical care time)  Medications Ordered in UC Medications  sodium chloride 0.9 % bolus 1,000 mL (0 mLs Intravenous Stopped 10/22/20 1704)    Initial Impression / Assessment and Plan / UC Course  I have reviewed the triage vital signs and the nursing notes.  Pertinent labs & imaging results that were available during my care of the patient were reviewed by me and considered in my medical decision making (see chart for details).   64 year old female who is followed closely by GI presents for decreased appetite for the past few months, weakness over the past week and concerns for C. difficile since she has had diarrhea after  taking MiraLAX.  I have reviewed notes from her GI specialist.  All vital signs are stable.  Oxygen is a little bit decreased at 94%.  She has slightly increased respiratory rate but lungs are clear to auscultation.  Advised chest x-ray since she has severe COPD and admits to feeling weak.  She declines at this time and says she just had a chest x-ray a week ago and does not want I have anywhere radiation this soon.  CBC and CMP obtained as well as UA.   Urinalysis is within normal limits. UA is within normal limits.  Urine not concentrated.  CBC shows slightly elevated RBCs and hemoglobin.  Patient is a current everyday smoker and this may be the cause.  CMP shows slightly elevated glucose at 134.  All electrolytes, renal function and LFTs are within normal limits.  Discussed all results with patient  Patient given 1000 mL bolus of normal saline.  Patient states she feels overall better.  Patient to return with containers to run the following stool studies: GI panel by PCR and C. difficile.  Advised we will call her with the results within a day or 2 of her dropping the specimens off.  Will treat if any results are positive requiring treatment.  Patient advised to follow-up with GI for continued concerns regarding nausea, decreased appetite and changes in bowel habit.  Advised to stop taking MiraLAX and increase fluids.  Advised her to incorporate more protein into her diet as well.  ED precautions discussed.  Advised to go to ED if she has any fever, increased fatigue or weakness, chest pain or breathing difficulty, dark stools or blood in the stool, difficulty urinating or having bowel movements, abdominal pain that is severe and different from what she has had before.   Final Clinical Impressions(s) / UC Diagnoses   Final diagnoses:  Diarrhea, unspecified type  Fatigue, unspecified type  Decreased appetite     Discharge Instructions     All of your labs look good today.  You have been given a bag of fluids.  Stop taking MiraLAX.  I suspect your diarrhea is due to taking the MiraLAX.  Increase your fluid intake.  If you feel that your mouth is getting dry keep a humidifier in your house.  Can also suck on sour candies.  We have sent you home with containers to obtain stool samples.  Bring those back at your earliest convenience and we will call you with the results within a couple days of the drop-off.  Follow-up with your GI specialist  to continue to investigate the cause of your decreased appetite and nausea.  Try to incorporate more protein into your diet.  I would advise lean meats such as chicken and [place] or fish, nuts, protein shakes  Follow-up with our department as needed.  Go to emergency department if you are feeling really weak, running a fever, having dark stools or bloody stools, having excessive diarrhea and/or vomiting, having breathing difficulty or any other new acute worsening of her symptoms.    ED Prescriptions    None     PDMP not reviewed this encounter.   Danton Clap, PA-C 10/22/20 1711

## 2020-10-23 ENCOUNTER — Other Ambulatory Visit
Admission: RE | Admit: 2020-10-23 | Discharge: 2020-10-23 | Disposition: A | Discharge status: Home / Self Care | Payer: 59 | Source: Ambulatory Visit | Attending: Physician Assistant | Admitting: Physician Assistant

## 2020-10-23 DIAGNOSIS — R197 Diarrhea, unspecified: Secondary | ICD-10-CM | POA: Insufficient documentation

## 2020-10-23 LAB — GASTROINTESTINAL PANEL BY PCR, STOOL (REPLACES STOOL CULTURE)

## 2020-10-23 LAB — C DIFFICILE QUICK SCREEN W PCR REFLEX
C Diff antigen: NEGATIVE
C Diff interpretation: NOT DETECTED
C Diff toxin: NEGATIVE

## 2020-10-26 ENCOUNTER — Telehealth: Payer: Self-pay | Admitting: Gastroenterology

## 2020-10-26 NOTE — Telephone Encounter (Signed)
Patient states she had to go to urgent care over the weekend with diarrhea and loss of appetite. Pt states she does not think she has the strength to do colonoscopy on 11.30.21 but wants to talk with Dr. Vicente Males or his nurse to discuss first. Pt had ov on 11.10.21.

## 2020-10-29 NOTE — Telephone Encounter (Signed)
Inform am on vacation, if can't eat any drink enquire reason is it poor appetite or pain  or nausea vomiting? Phone call visit when I return , she can also discuss this with pcp

## 2020-10-30 ENCOUNTER — Other Ambulatory Visit
Admission: RE | Admit: 2020-10-30 | Discharge: 2020-10-30 | Disposition: A | Discharge status: Home / Self Care | Payer: 59 | Source: Ambulatory Visit | Attending: Gastroenterology | Admitting: Gastroenterology

## 2020-10-30 DIAGNOSIS — Z20822 Contact with and (suspected) exposure to covid-19: Secondary | ICD-10-CM | POA: Insufficient documentation

## 2020-10-30 DIAGNOSIS — Z01812 Encounter for preprocedural laboratory examination: Secondary | ICD-10-CM | POA: Insufficient documentation

## 2020-11-02 ENCOUNTER — Other Ambulatory Visit: Payer: Self-pay

## 2020-11-02 LAB — SARS CORONAVIRUS 2 (TAT 6-24 HRS): SARS Coronavirus 2: NEGATIVE

## 2020-11-02 NOTE — Telephone Encounter (Signed)
Pt states her symptoms improved after taking zofran.

## 2020-11-03 ENCOUNTER — Ambulatory Visit: Payer: 59 | Admitting: Certified Registered"

## 2020-11-03 ENCOUNTER — Encounter
Admission: RE | Disposition: A | Discharge status: Home / Self Care | Payer: Self-pay | Source: Home / Self Care | Attending: Gastroenterology

## 2020-11-03 ENCOUNTER — Encounter: Payer: Self-pay | Admitting: Gastroenterology

## 2020-11-03 ENCOUNTER — Other Ambulatory Visit: Payer: Self-pay

## 2020-11-03 ENCOUNTER — Ambulatory Visit
Admission: RE | Admit: 2020-11-03 | Discharge: 2020-11-03 | Disposition: A | Discharge status: Home / Self Care | Payer: 59 | Attending: Gastroenterology | Admitting: Gastroenterology

## 2020-11-03 DIAGNOSIS — K222 Esophageal obstruction: Secondary | ICD-10-CM | POA: Insufficient documentation

## 2020-11-03 DIAGNOSIS — R194 Change in bowel habit: Secondary | ICD-10-CM | POA: Insufficient documentation

## 2020-11-03 DIAGNOSIS — K635 Polyp of colon: Secondary | ICD-10-CM | POA: Insufficient documentation

## 2020-11-03 DIAGNOSIS — Z85820 Personal history of malignant melanoma of skin: Secondary | ICD-10-CM | POA: Diagnosis not present

## 2020-11-03 DIAGNOSIS — Z7951 Long term (current) use of inhaled steroids: Secondary | ICD-10-CM | POA: Insufficient documentation

## 2020-11-03 DIAGNOSIS — R1310 Dysphagia, unspecified: Secondary | ICD-10-CM | POA: Diagnosis not present

## 2020-11-03 DIAGNOSIS — Z8616 Personal history of COVID-19: Secondary | ICD-10-CM | POA: Insufficient documentation

## 2020-11-03 DIAGNOSIS — F17210 Nicotine dependence, cigarettes, uncomplicated: Secondary | ICD-10-CM | POA: Insufficient documentation

## 2020-11-03 DIAGNOSIS — Z79899 Other long term (current) drug therapy: Secondary | ICD-10-CM | POA: Insufficient documentation

## 2020-11-03 HISTORY — PX: COLONOSCOPY WITH PROPOFOL: SHX5780

## 2020-11-03 HISTORY — PX: ESOPHAGOGASTRODUODENOSCOPY (EGD) WITH PROPOFOL: SHX5813

## 2020-11-03 SURGERY — COLONOSCOPY WITH PROPOFOL
Anesthesia: General

## 2020-11-03 MED ORDER — PROPOFOL 500 MG/50ML IV EMUL
INTRAVENOUS | Status: DC | PRN
  Administered 2020-11-03: 130 ug/kg/min via INTRAVENOUS

## 2020-11-03 MED ORDER — ONDANSETRON HCL 4 MG/2ML IJ SOLN
INTRAMUSCULAR | Status: DC | PRN
  Administered 2020-11-03: 4 mg via INTRAVENOUS

## 2020-11-03 MED ORDER — SODIUM CHLORIDE 0.9 % IV SOLN: INTRAVENOUS | Status: DC

## 2020-11-03 MED ORDER — PHENYLEPHRINE HCL (PRESSORS) 10 MG/ML IV SOLN
INTRAVENOUS | Status: DC | PRN
  Administered 2020-11-03 (×2): 50 ug via INTRAVENOUS

## 2020-11-03 NOTE — Anesthesia Preprocedure Evaluation (Signed)
Anesthesia Evaluation  Patient identified by MRN, date of birth, ID band Patient awake    Reviewed: Allergy & Precautions, H&P , NPO status , Patient's Chart, lab work & pertinent test results, reviewed documented beta blocker date and time   History of Anesthesia Complications Negative for: history of anesthetic complications  Airway Mallampati: II  TM Distance: >3 FB Neck ROM: full    Dental  (+) Caps, Dental Advidsory Given, Teeth Intact   Pulmonary neg shortness of breath, neg sleep apnea, COPD, Recent URI , Resolved, Current Smoker,    Pulmonary exam normal breath sounds clear to auscultation       Cardiovascular Exercise Tolerance: Good negative cardio ROS Normal cardiovascular exam Rhythm:regular Rate:Normal     Neuro/Psych negative neurological ROS  negative psych ROS   GI/Hepatic Neg liver ROS, GERD  ,  Endo/Other  negative endocrine ROS  Renal/GU negative Renal ROS  negative genitourinary   Musculoskeletal   Abdominal   Peds  Hematology negative hematology ROS (+)   Anesthesia Other Findings Past Medical History: No date: Acid reflux No date: Cancer ([place])     Comment:  squamous cell and melanoma No date: GERD (gastroesophageal reflux disease) 10/28/2019: History of 2019 novel coronavirus disease (COVID-19) No date: Hyperlipidemia No date: Osteoporosis 1983: Thrombophlebitis No date: Unspecified venous (peripheral) insufficiency   Reproductive/Obstetrics negative OB ROS                             Anesthesia Physical Anesthesia Plan  ASA: II  Anesthesia Plan: General   Post-op Pain Management:    Induction:   PONV Risk Score and Plan: 2 and Propofol infusion and TIVA  Airway Management Planned: Natural Airway and Nasal Cannula  Additional Equipment:   Intra-op Plan:   Post-operative Plan:   Informed Consent: I have reviewed the patients History and  Physical, chart, labs and discussed the procedure including the risks, benefits and alternatives for the proposed anesthesia with the patient or authorized representative who has indicated his/her understanding and acceptance.     Dental Advisory Given  Plan Discussed with: Anesthesiologist, CRNA and Surgeon  Anesthesia Plan Comments:         Anesthesia Quick Evaluation

## 2020-11-03 NOTE — Anesthesia Postprocedure Evaluation (Signed)
Anesthesia Post Note  Patient: DARRELLE BARRELL  Procedure(s) Performed: COLONOSCOPY WITH PROPOFOL (N/A ) ESOPHAGOGASTRODUODENOSCOPY (EGD) WITH PROPOFOL (N/A )  Patient location during evaluation: Endoscopy Anesthesia Type: General Level of consciousness: awake and alert Pain management: pain level controlled Vital Signs Assessment: post-procedure vital signs reviewed and stable Respiratory status: spontaneous breathing, nonlabored ventilation, respiratory function stable and patient connected to nasal cannula oxygen Cardiovascular status: blood pressure returned to baseline and stable Postop Assessment: no apparent nausea or vomiting Anesthetic complications: no   No complications documented.   Last Vitals:  Vitals:   11/03/20 1020 11/03/20 1030  BP: (!) 92/56 112/79  Pulse: 84 89  Resp: (!) 26 18  Temp:    SpO2: 97% 99%    Last Pain:  Vitals:   11/03/20 1010  TempSrc: Temporal  PainSc:                  Martha Clan

## 2020-11-03 NOTE — Op Note (Addendum)
[facility] Gastroenterology Patient Name: Natasha [name] Procedure Date: 11/03/2020 9:37 AM MRN: [id number] Account #: [id number] Date of Birth: 05-10-1956 Admit Type: Outpatient Age: 64 Room: [facility] ENDO ROOM 2 Gender: Female Note Status: Finalized Procedure:             Colonoscopy Indications:           Change in bowel habits Providers:             Jonathon Bellows MD, MD Medicines:             Monitored Anesthesia Care Complications:         No immediate complications. Procedure:             Pre-Anesthesia Assessment:                        - Prior to the procedure, a History and Physical was                         performed, and patient medications, allergies and                         sensitivities were reviewed. The patient's tolerance                         of previous anesthesia was reviewed.                        - The risks and benefits of the procedure and the                         sedation options and risks were discussed with the                         patient. All questions were answered and informed                         consent was obtained.                        - ASA Grade Assessment: II - A patient with mild                         systemic disease.                        After obtaining informed consent, the colonoscope was                         passed under direct vision. Throughout the procedure,                         the patient's blood pressure, pulse, and oxygen                         saturations were monitored continuously. The                         Colonoscope was introduced through the anus and  advanced to the the terminal ileum. The colonoscopy                         was performed with ease. The patient tolerated the                         procedure well. The quality of the bowel preparation                         was excellent. Findings:      The perianal and digital rectal examinations were  normal.      Normal mucosa was found in the entire colon. Biopsies for histology were       taken with a cold forceps from the entire colon for evaluation of       microscopic colitis.      The terminal ileum appeared normal. This was biopsied with a cold       forceps for histology.      The exam was otherwise without abnormality on direct and retroflexion       views.      Multiple small and large-mouthed diverticula were found in the left       colon.      A 6 mm polyp was found in the sigmoid colon. The polyp was sessile. The       polyp was removed with a cold snare. Resection and retrieval were       complete. Impression:            - Normal mucosa in the entire examined colon. Biopsied.                        - The examined portion of the ileum was normal.                         Biopsied.                        - The examination was otherwise normal on direct and                         retroflexion views. Recommendation:        - Discharge patient to home (with escort).                        - Resume previous diet.                        - Continue present medications.                        - Await pathology results.                        - Repeat colonoscopy for surveillance based on                         pathology results.                        - Return to GI office as previously scheduled. Procedure Code(s):     --- Professional ---  45385, Colonoscopy, flexible; with removal of                         tumor(s), polyp(s), or other lesion(s) by snare                         technique                        45380, 55, Colonoscopy, flexible; with biopsy, single                         or multiple Diagnosis Code(s):     --- Professional ---                        R19.4, Change in bowel habit CPT copyright 2019 American Medical Association. All rights reserved. The codes documented in this report are preliminary and upon coder review may  be  revised to meet current compliance requirements. Jonathon Bellows, MD Jonathon Bellows MD, MD 11/03/2020 10:19:07 AM This report has been signed electronically. Number of Addenda: 0 Note Initiated On: 11/03/2020 9:37 AM Scope Withdrawal Time: 0 hours 11 minutes 45 seconds  Total Procedure Duration: 0 hours 14 minutes 41 seconds  Estimated Blood Loss:  Estimated blood loss: none.      [facility]

## 2020-11-03 NOTE — H&P (Signed)
Jonathon Bellows, MD [address] [place], [place], [place], [address] [address], [place], [place], [place], [address] Phone: [phone]  Fax: [phone]  Primary Care Physician:  Patient, No Pcp Per   Pre-Procedure History & Physical: HPI:  Natasha [name] is a 64 y.o. female is here for an endoscopy and colonoscopy    Past Medical History:  Diagnosis Date  . Acid reflux   . Cancer (HCC)    squamous cell and melanoma  . GERD (gastroesophageal reflux disease)   . History of 2019 novel coronavirus disease (COVID-19) 10/28/2019  . Hyperlipidemia   . Osteoporosis   . Thrombophlebitis 1983  . Unspecified venous (peripheral) insufficiency     Past Surgical History:  Procedure Laterality Date  . CHOLECYSTECTOMY  2007  . COLONOSCOPY  2008  . LAPAROSCOPY  2007    Prior to Admission medications   Medication Sig Start Date End Date Taking? Authorizing Provider  albuterol (PROVENTIL HFA;VENTOLIN HFA) 108 (90 Base) MCG/ACT inhaler Inhale into the lungs every 6 (six) hours as needed for wheezing or shortness of breath.    [provider]  albuterol (PROVENTIL) (2.5 MG/3ML) 0.083% nebulizer solution Take 2.5 mg by nebulization every 6 (six) hours as needed for wheezing.    [provider]  cetirizine (ZYRTEC) 10 MG tablet Take 10 mg by mouth at bedtime.    [provider]  famotidine (PEPCID) 20 MG tablet Take 1 tablet (20 mg total) by mouth 2 (two) times daily. 04/16/20   Karen Kitchens, NP  hydrOXYzine (ATARAX/VISTARIL) 25 MG tablet Take 1 tablet (25 mg total) by mouth every 6 (six) hours as needed for itching. 02/12/20   Melynda Ripple, MD  ibuprofen (ADVIL) 600 MG tablet Take 1 tablet (600 mg total) by mouth every 6 (six) hours as needed. 02/12/20   Melynda Ripple, MD  loperamide (IMODIUM) 2 MG capsule Take 1 capsule (2 mg total) by mouth 3 (three) times daily as needed for diarrhea or loose stools. 04/16/20   Karen Kitchens, NP  omeprazole  (PRILOSEC) 40 MG capsule Take 1 capsule (40 mg total) by mouth daily. 10/14/20   Jonathon Bellows, MD  ondansetron (ZOFRAN-ODT) 4 MG disintegrating tablet Take 1 tablet (4 mg total) by mouth every 8 (eight) hours as needed. 04/16/20   Karen Kitchens, NP  polyethylene glycol (MIRALAX / GLYCOLAX) 17 g packet Take 17 g by mouth daily.    [provider]  predniSONE (DELTASONE) 20 MG tablet Take 40 mg by mouth daily. 04/10/20   [provider]  sucralfate (CARAFATE) 1 g tablet Take 1 tablet (1 g total) by mouth 4 (four) times daily -  with meals and at bedtime. 04/16/20   Karen Kitchens, NP  Tiotropium Bromide Monohydrate (SPIRIVA RESPIMAT) 1.25 MCG/ACT AERS Inhale into the lungs.    [provider]  traMADol (ULTRAM) 50 MG tablet Take 50 mg by mouth every 6 (six) hours as needed. 04/10/20   [provider]  TRELEGY ELLIPTA 100-62.5-25 MCG/INH AEPB Inhale 1 puff into the lungs daily. 04/02/20   [provider]  triamcinolone (KENALOG) 0.025 % ointment Apply 1 application topically 2 (two) times daily. 02/12/20   Melynda Ripple, MD  Calcium Citrate-Vitamin D (CALCIUM + D PO) Take 1 tablet by mouth daily.   02/12/20  [provider]    Allergies as of 10/14/2020 - Review Complete 10/14/2020  Allergen Reaction Noted  . Codeine Rash and Hives 02/21/2016  .  Ciprofloxacin Diarrhea 06/11/2019  . Hydrocodone-acetaminophen Nausea Only 03/31/2016    Family History  Problem Relation Age of Onset  . Breast cancer Mother 51    Social History   Socioeconomic History  . Marital status: Single    Spouse name: Not on file  . Number of children: Not on file  . Years of education: Not on file  . Highest education level: Not on file  Occupational History  . Not on file  Tobacco Use  . Smoking status: Current Every Day Smoker    Packs/day: 1.00    Years: 30.00    Pack years: 30.00  . Smokeless tobacco: Never Used  Vaping Use  . Vaping Use: Never used   Substance and Sexual Activity  . Alcohol use: Yes  . Drug use: No  . Sexual activity: Not on file  Other Topics Concern  . Not on file  Social History Narrative  . Not on file   Social Determinants of Health   Financial Resource Strain:   . Difficulty of Paying Living Expenses: Not on file  Food Insecurity:   . Worried About Charity fundraiser in the Last Year: Not on file  . Ran Out of Food in the Last Year: Not on file  Transportation Needs:   . Lack of Transportation (Medical): Not on file  . Lack of Transportation (Non-Medical): Not on file  Physical Activity:   . Days of Exercise per Week: Not on file  . Minutes of Exercise per Session: Not on file  Stress:   . Feeling of Stress : Not on file  Social Connections:   . Frequency of Communication with Friends and Family: Not on file  . Frequency of Social Gatherings with Friends and Family: Not on file  . Attends Religious Services: Not on file  . Active Member of Clubs or Organizations: Not on file  . Attends Archivist Meetings: Not on file  . Marital Status: Not on file  Intimate Partner Violence:   . Fear of Current or Ex-Partner: Not on file  . Emotionally Abused: Not on file  . Physically Abused: Not on file  . Sexually Abused: Not on file    Review of Systems: See HPI, otherwise negative ROS  Physical Exam: BP (!) 139/104   Pulse (!) 112   Temp (!) 97.1 F (36.2 C) (Temporal)   Resp 20   Ht 5\' 2"  (1.575 m)   Wt 61.6 kg   SpO2 93%   BMI 24.84 kg/m  General:   Alert,  pleasant and cooperative in NAD Head:  Normocephalic and atraumatic. Neck:  Supple; no masses or thyromegaly. Lungs:  Clear throughout to auscultation, normal respiratory effort.    Heart:  +S1, +S2, Regular rate and rhythm, No edema. Abdomen:  Soft, nontender and nondistended. Normal bowel sounds, without guarding, and without rebound.   Neurologic:  Alert and  oriented x4;  grossly normal  neurologically.  Impression/Plan: Natasha [name] is here for an endoscopy and colonoscopy  to be performed for  evaluation of dysphagia and change in bowel habits    Risks, benefits, limitations, and alternatives regarding endoscopy have been reviewed with the patient.  Questions have been answered.  All parties agreeable.   Jonathon Bellows, MD  11/03/2020, 9:36 AM

## 2020-11-03 NOTE — Op Note (Signed)
[facility] Gastroenterology Patient Name: Natasha [name] Procedure Date: 11/03/2020 9:38 AM MRN: [id number] Account #: [id number] Date of Birth: 1956-07-29 Admit Type: Outpatient Age: 64 Room: [facility] ENDO ROOM 2 Gender: Female Note Status: Finalized Procedure:             Upper GI endoscopy Indications:           Dysphagia Providers:             Jonathon Bellows MD, MD Referring MD:          Kerin Perna MD, MD (Referring MD) Medicines:             Monitored Anesthesia Care Complications:         No immediate complications. Procedure:             Pre-Anesthesia Assessment:                        - Prior to the procedure, a History and Physical was                         performed, and patient medications, allergies and                         sensitivities were reviewed. The patient's tolerance                         of previous anesthesia was reviewed.                        - The risks and benefits of the procedure and the                         sedation options and risks were discussed with the                         patient. All questions were answered and informed                         consent was obtained.                        - ASA Grade Assessment: II - A patient with mild                         systemic disease.                        After obtaining informed consent, the endoscope was                         passed under direct vision. Throughout the procedure,                         the patient's blood pressure, pulse, and oxygen                         saturations were monitored continuously. The Endoscope                         was introduced through the mouth, and  advanced to the                         third part of duodenum. The upper GI endoscopy was                         accomplished with ease. The patient tolerated the                         procedure well. Findings:      A widely patent Schatzki ring was found at the  gastroesophageal       junction. A TTS dilator was passed through the scope. Dilation with a       15-16.5-18 mm balloon dilator was performed to 18 mm. The dilation site       was examined and showed no change.      Normal mucosa was found in the entire esophagus. Biopsies were taken       with a cold forceps for histology.      The stomach was normal.      The cardia and gastric fundus were normal on retroflexion.      The examined duodenum was normal. Impression:            - Widely patent Schatzki ring. Dilated.                        - Normal mucosa was found in the entire esophagus.                         Biopsied.                        - Normal stomach.                        - Normal examined duodenum. Recommendation:        - Await pathology results.                        - If has no EOE then obtaqin esophageal manomertry and                         ph testing                        - Perform a colonoscopy today. Procedure Code(s):     --- Professional ---                        [id number], Esophagogastroduodenoscopy, flexible,                         transoral; with transendoscopic balloon dilation of                         esophagus (less than 30 mm diameter)                        43239, 59, Esophagogastroduodenoscopy, flexible,                         transoral; with biopsy, single or multiple Diagnosis Code(s):     ---  Professional ---                        K22.2, Esophageal obstruction                        R13.10, Dysphagia, unspecified CPT copyright 2019 American Medical Association. All rights reserved. The codes documented in this report are preliminary and upon coder review may  be revised to meet current compliance requirements. Jonathon Bellows, MD Jonathon Bellows MD, MD 11/03/2020 9:55:27 AM This report has been signed electronically. Number of Addenda: 0 Note Initiated On: 11/03/2020 9:38 AM Estimated Blood Loss:  Estimated blood loss: none.      [facility]

## 2020-11-03 NOTE — Transfer of Care (Signed)
Immediate Anesthesia Transfer of Care Note  Patient: Natasha [name]  Procedure(s) Performed: COLONOSCOPY WITH PROPOFOL (N/A ) ESOPHAGOGASTRODUODENOSCOPY (EGD) WITH PROPOFOL (N/A )  Patient Location: PACU and Endoscopy Unit  Anesthesia Type:General  Level of Consciousness: drowsy  Airway & Oxygen Therapy: Patient Spontanous Breathing  Post-op Assessment: Report given to RN  Post vital signs: stable  Last Vitals:  Vitals Value Taken Time  BP    Temp    Pulse    Resp    SpO2      Last Pain:  Vitals:   11/03/20 0906  TempSrc: Temporal  PainSc: 3          Complications: No complications documented.

## 2020-11-04 ENCOUNTER — Encounter: Payer: Self-pay | Admitting: Gastroenterology

## 2020-11-04 LAB — SURGICAL PATHOLOGY

## 2020-11-13 ENCOUNTER — Telehealth: Payer: Self-pay

## 2020-11-13 NOTE — Telephone Encounter (Signed)
Pt left voicemail stating she's been having 4 bowel movements a day that are diarrhea-like since her colonoscopy. Pt is requesting Dr. Georgeann Oppenheim advice.

## 2020-11-13 NOTE — Telephone Encounter (Signed)
Informed pt of Dr. Georgeann Oppenheim recommendation. Pt states she'd like to discuss this medication with Dr. Vicente Males before we prescribe. Pt agrees to schedule a telephone appointment to discuss.

## 2020-11-13 NOTE — Telephone Encounter (Signed)
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

## 2020-11-16 NOTE — Telephone Encounter (Signed)
Patient scheduled for 12.16.21 for virtual phone visit. Spoke to pt who verbalized understanding.

## 2020-11-18 NOTE — Telephone Encounter (Signed)
open encounter in error 

## 2020-11-19 ENCOUNTER — Telehealth (INDEPENDENT_AMBULATORY_CARE_PROVIDER_SITE_OTHER): Payer: 59 | Admitting: Gastroenterology

## 2020-11-19 DIAGNOSIS — K580 Irritable bowel syndrome with diarrhea: Secondary | ICD-10-CM | POA: Diagnosis not present

## 2020-11-19 MED ORDER — DICYCLOMINE HCL 10 MG PO CAPS: 10.0000 mg | ORAL_CAPSULE | Freq: Three times a day (TID) | ORAL | 0 refills | Status: DC

## 2020-11-19 NOTE — Progress Notes (Signed)
Natasha [name] , MD [address]  [place]  [place], [place] [address]  Main: [phone]  Fax: [phone]   Primary Care Physician: Patient, No Pcp Per  Virtual Visit via Telephone Note  I connected with patient on 11/19/20 at 11:00 AM EST by telephone and verified that I am speaking with the correct person using two identifiers.   I discussed the limitations, risks, security and privacy concerns of performing an evaluation and management service by telephone and the availability of in person appointments. I also discussed with the patient that there may be a patient responsible charge related to this service. The patient expressed understanding and agreed to proceed.  Location of Patient: Home Location of Provider: Home Persons involved: Patient and provider only   History of Present Illness:  Discuss results of the biopsies   HPI: Natasha [name] is a 64 y.o. female   Summary of history :   She was last seen in my office on 07/30/2019.  History of C. difficile colitis.  Admitted to the hospital in July 2020 for abdominal pain and one bloody bowel movement.CT scan on admission showed inflammatory changes of the distal transverse colon proximal descending and part of the sigmoid colon. Differentials include infectious versus an ischemic or inflammatory colitis. Impression at that point of time was it likely was an ischemic colitis. Fecal lactoferrin was elevated.At discharge she was given a course of Flagyl and cefdinir.  After going home the bowel movement normalized.  09/30/2020: CT scan of the abdomen and pelvis with contrast showed rectosigmoid diverticulosis without any acute inflammation. 09/18/2020: CMP normal, hemoglobin 14.5 g.  Interval history  10/14/2020-11/19/2020   11/03/2020: EGD: Sctazkis ring dilated to 18 mm , biopsies of the esophagus showed no abnormalities.  Colonoscopy : normal TI, random colon biopsies.  Still complains of left-sided  abdominal pain relieved after bowel movement.  Has up to 4 bowel movements per day.  Still smoking 1 cigarette a day.  Not taking any MiraLAX as she was having diarrhea.   Current Outpatient Medications  Medication Sig Dispense Refill  . albuterol (PROVENTIL HFA;VENTOLIN HFA) 108 (90 Base) MCG/ACT inhaler Inhale into the lungs every 6 (six) hours as needed for wheezing or shortness of breath.    Marland Kitchen albuterol (PROVENTIL) (2.5 MG/3ML) 0.083% nebulizer solution Take 2.5 mg by nebulization every 6 (six) hours as needed for wheezing.    . cetirizine (ZYRTEC) 10 MG tablet Take 10 mg by mouth at bedtime.    . famotidine (PEPCID) 20 MG tablet Take 1 tablet (20 mg total) by mouth 2 (two) times daily. 30 tablet 0  . hydrOXYzine (ATARAX/VISTARIL) 25 MG tablet Take 1 tablet (25 mg total) by mouth every 6 (six) hours as needed for itching. 20 tablet 0  . ibuprofen (ADVIL) 600 MG tablet Take 1 tablet (600 mg total) by mouth every 6 (six) hours as needed. 30 tablet 0  . loperamide (IMODIUM) 2 MG capsule Take 1 capsule (2 mg total) by mouth 3 (three) times daily as needed for diarrhea or loose stools. 15 capsule 0  . omeprazole (PRILOSEC) 40 MG capsule Take 1 capsule (40 mg total) by mouth daily. 90 capsule 1  . ondansetron (ZOFRAN-ODT) 4 MG disintegrating tablet Take 1 tablet (4 mg total) by mouth every 8 (eight) hours as needed. 15 tablet 0  . polyethylene glycol (MIRALAX / GLYCOLAX) 17 g packet Take 17 g by mouth daily.    . predniSONE (DELTASONE) 20 MG tablet Take  40 mg by mouth daily.    . sucralfate (CARAFATE) 1 g tablet Take 1 tablet (1 g total) by mouth 4 (four) times daily -  with meals and at bedtime. 120 tablet 0  . Tiotropium Bromide Monohydrate (SPIRIVA RESPIMAT) 1.25 MCG/ACT AERS Inhale into the lungs.    . traMADol (ULTRAM) 50 MG tablet Take 50 mg by mouth every 6 (six) hours as needed.    . TRELEGY ELLIPTA 100-62.5-25 MCG/INH AEPB Inhale 1 puff into the lungs daily.    Marland Kitchen triamcinolone (KENALOG)  0.025 % ointment Apply 1 application topically 2 (two) times daily. 30 g 0   No current facility-administered medications for this visit.    Allergies as of 11/19/2020 - Review Complete 11/03/2020  Allergen Reaction Noted  . Codeine Rash and Hives 02/21/2016  . Ciprofloxacin Diarrhea 06/11/2019  . Hydrocodone-acetaminophen Nausea Only 03/31/2016    Review of Systems:    All systems reviewed and negative except where noted in HPI.   Observations/Objective:  Labs: CMP     Component Value Date/Time   NA 138 10/22/2020 1620   NA 136 06/17/2019 1516   K 4.6 10/22/2020 1620   CL 98 10/22/2020 1620   CO2 30 10/22/2020 1620   GLUCOSE 134 (H) 10/22/2020 1620   BUN 13 10/22/2020 1620   BUN 9 06/17/2019 1516   CREATININE 0.89 10/22/2020 1620   CALCIUM 9.3 10/22/2020 1620   PROT 7.4 10/22/2020 1620   PROT 5.9 (L) 06/17/2019 1516   ALBUMIN 4.1 10/22/2020 1620   ALBUMIN 4.1 06/17/2019 1516   AST 23 10/22/2020 1620   ALT 20 10/22/2020 1620   ALKPHOS 98 10/22/2020 1620   BILITOT 0.3 10/22/2020 1620   BILITOT 0.3 06/17/2019 1516   GFRNONAA >60 10/22/2020 1620   GFRAA >60 04/16/2020 1504   Lab Results  Component Value Date   WBC 6.3 10/22/2020   HGB 15.1 (H) 10/22/2020   HCT 45.9 10/22/2020   MCV 89.1 10/22/2020   PLT 275 10/22/2020    Imaging Studies: No results found.  Assessment and Plan:   Natasha [name] is a 64 y.o. y/o female here to follow up after her procedures which were performed for  abdominal pain and dysphagia.    Dysphagia better after dilating a Schatzki's ring.  Colonoscopy showed no abnormalities of the colon.  She he presently has abdominal pain relieved after bowel movement up to 4 times a day.  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  Differentials also include extrapancreatic insufficiency with history of smoking.   Plan 1.  Bentyl 4 times daily as needed for cramping 2.  Trial of Creon for extrapancreatic insufficiency 3.  If no better could consider a course  of Xifaxan.   Follow-up in 3 to 4 weeks in the office I discussed the assessment and treatment plan with the patient. The patient was provided an opportunity to ask questions and all were answered. The patient agreed with the plan and demonstrated an understanding of the instructions.   The patient was advised to call back or seek an in-person evaluation if the symptoms worsen or if the condition fails to improve as anticipated.  I provided 13 minutes of non-face-to-face time during this encounter.  Dr Natasha Bellows MD,MRCP [facility]) Gastroenterology/Hepatology Pager: [phone]   Speech recognition software was used to dictate this note.

## 2020-11-25 ENCOUNTER — Telehealth: Payer: Self-pay | Admitting: Gastroenterology

## 2020-11-25 NOTE — Telephone Encounter (Signed)
1. Pt states she is taking the creon before every meal. She is not sure on the dosage. 2. Pt states today she is having yellow watery diarrhea and other days usually lose stools.  3. Notified to drink water.

## 2020-11-25 NOTE — Telephone Encounter (Signed)
Patient states she is feeling a tad bit better but is still having a BM 4x daily, left abd pain, and now dry mouth. Pt states Dr. Vicente Males told her to call back and let him know if she was not improving. Please advise.

## 2020-11-25 NOTE — Telephone Encounter (Signed)
1. Is she taking the Creon which I suggested on 11/19/2020. If yes what dose. 2. What consistency is her bowel movement as it forme or watery. Is it better than what it was on not.  3. None of these medications should be causing her dry mouth. Suggest to drink frequent sips of water to keep the mouth wet.

## 2020-11-26 NOTE — Telephone Encounter (Signed)
CREOn needs to be taken at the correct dosage otherwise wont work   Start taking 2 tablets of 35,000 units each before each meal and 1 before eash snack  Advise her to update [place] in a week after taking the correct dose daily and will then assess if CREON has helped or not

## 2020-12-01 NOTE — Telephone Encounter (Signed)
Pt has been notified of Dr. Anna's recommendations. 

## 2020-12-01 NOTE — Telephone Encounter (Signed)
Called pt to inform of Dr. Anna's recommendations.  Unable to contact, LVM to return call 

## 2020-12-07 ENCOUNTER — Encounter: Payer: Self-pay | Admitting: Gastroenterology

## 2020-12-07 ENCOUNTER — Ambulatory Visit: Payer: 59 | Admitting: Gastroenterology

## 2020-12-07 ENCOUNTER — Other Ambulatory Visit: Payer: Self-pay

## 2020-12-07 VITALS — BP 110/71 | HR 94 | Temp 98.0°F | Ht 62.0 in | Wt 135.4 lb

## 2020-12-07 DIAGNOSIS — K8689 Other specified diseases of pancreas: Secondary | ICD-10-CM | POA: Diagnosis not present

## 2020-12-07 DIAGNOSIS — J209 Acute bronchitis, unspecified: Secondary | ICD-10-CM | POA: Insufficient documentation

## 2020-12-07 MED ORDER — PANCRELIPASE (LIP-PROT-AMYL) 36000-114000 UNITS PO CPEP: ORAL_CAPSULE | ORAL | 3 refills | Status: DC

## 2020-12-07 NOTE — Progress Notes (Signed)
Wyline Mood MD, MRCP(U.K) [address]  [address]  [place], [place] [address]  Main: [phone]  Fax: [phone]   Primary Care Physician: Patient, No Pcp Per  Primary Gastroenterologist:  Dr. Wyline Mood   Diarrhea  HPI: Natasha [name] is a 65 y.o. female  Summary of history :   She has a priorhistory of C. difficile colitis. Admitted to the hospital in July 2020 for abdominal pain and one bloody bowel movement.CT scan on admission showed inflammatory changes of the distal transverse colon proximal descending and part of the sigmoid colon. Differentials include infectious versus an ischemic or inflammatory colitis. Impression at that point of time was it likely was an ischemic colitis. Fecal lactoferrin was elevated.At discharge she was given a course of Flagyl and cefdinir.After going home the bowel movement normalized.  09/30/2020: CT scan of the abdomen and pelvis with contrast showed rectosigmoid diverticulosis without any acute inflammation. 09/18/2020: CMP normal, hemoglobin 14.5 g. 11/03/2020: EGD: Sctazkis ring dilated to 18 mm , biopsies of the esophagus showed no abnormalities.  Colonoscopy : normal TI, random colon biopsies.   Interval history12/16/2021 -12/07/2020  When she took her samples of Creon felt much better stools are more formed and fewer in frequency.  She is trying to stop smoking.  Once she ran out of her samples the diarrhea has returned.   Current Outpatient Medications  Medication Sig Dispense Refill  . cetirizine (ZYRTEC) 10 MG tablet Take 10 mg by mouth at bedtime.    . dicyclomine (BENTYL) 10 MG capsule Take 1 capsule (10 mg total) by mouth 4 (four) times daily -  before meals and at bedtime. 90 capsule 0  . ipratropium-albuterol (DUONEB) 0.5-2.5 (3) MG/3ML SOLN Inhale into the lungs.    . nystatin (MYCOSTATIN) 100000 UNIT/ML suspension Take by mouth.    Marland Kitchen omeprazole (PRILOSEC) 40 MG capsule Take 1 capsule (40 mg total) by  mouth daily. 90 capsule 1  . ondansetron (ZOFRAN) 4 MG tablet Take by mouth.    . Probiotic Product (PROBIOMAX DAILY DF) CAPS     . Probiotic Product (PROBIOTIC PO) Take by mouth.    . Tiotropium Bromide Monohydrate (SPIRIVA RESPIMAT) 1.25 MCG/ACT AERS Inhale into the lungs.    Marland Kitchen albuterol (PROVENTIL HFA;VENTOLIN HFA) 108 (90 Base) MCG/ACT inhaler Inhale into the lungs every 6 (six) hours as needed for wheezing or shortness of breath.    Marland Kitchen buPROPion (WELLBUTRIN XL) 150 MG 24 hr tablet Take by mouth. (Patient not taking: Reported on 12/07/2020)    . guaiFENesin (MUCINEX PO) Take by mouth. (Patient not taking: Reported on 12/07/2020)     No current facility-administered medications for this visit.    Allergies as of 12/07/2020 - Review Complete 12/07/2020  Allergen Reaction Noted  . Codeine Rash and Hives 02/21/2016  . Budesonide-formoterol fumarate Other (See Comments) 11/27/2019  . Ciprofloxacin Diarrhea 06/11/2019  . Hydrocodone-acetaminophen Nausea Only 03/31/2016    ROS:  General: Negative for anorexia, weight loss, fever, chills, fatigue, weakness. ENT: Negative for hoarseness, difficulty swallowing , nasal congestion. CV: Negative for chest pain, angina, palpitations, dyspnea on exertion, peripheral edema.  Respiratory: Negative for dyspnea at rest, dyspnea on exertion, cough, sputum, wheezing.  GI: See history of present illness. GU:  Negative for dysuria, hematuria, urinary incontinence, urinary frequency, nocturnal urination.  Endo: Negative for unusual weight change.    Physical Examination:   BP 110/71   Pulse 94   Temp 98 F (36.7 C)   Ht 5\' 2"  (  1.575 m)   Wt 135 lb 6.4 oz (61.4 kg)   BMI 24.76 kg/m   General: Well-nourished, well-developed in no acute distress.  Eyes: No icterus. Conjunctivae pink. Neuro: Alert and oriented x 3.  Grossly intact. Skin: Warm and dry, no jaundice.   Psych: Alert and cooperative, normal mood and affect.   Imaging Studies: No  results found.  Assessment and Plan:   Natasha [name] is a 65 y.o. y/o female here to follow-up for diarrhea .  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  Plan 1.   Provide prescription for Creon, advised to stop smoking 2.  If no better could consider a course of Xifaxan  Dr Wyline Mood  MD,MRCP [facility]) Follow up in as needed

## 2020-12-07 NOTE — Addendum Note (Signed)
Addended by: Avie Arenas on: 12/07/2020 03:26 PM   Modules accepted: Orders

## 2020-12-14 ENCOUNTER — Telehealth: Payer: Self-pay | Admitting: Gastroenterology

## 2020-12-14 NOTE — Telephone Encounter (Signed)
Patient has been informed of scan results. Results have been right faxed to PCP. Pt had questions regarding her medication. Note has been forwarded to Dr. Vicente Males. Pt verbalized understanding.

## 2020-12-14 NOTE — Telephone Encounter (Signed)
Patient states she went to the ER at [facility] Saturday 1.8.22 for abd pain. Pt states her left side was hurting and now it's her right. Patient wants to know what Dr. Vicente Males suggests.

## 2020-12-14 NOTE — Telephone Encounter (Signed)
Inform    I reviewed CT scan from yesterday   " Streaky left basilar opacity, likely representing atelectasis.  Patchy left upper lung opacity which may represent atelectasis versus infection or external skin fold.  ==================== ADDENDUM (12/13/2020 8:37 AM):  On review, the following additional findings were noted:  Streaky bibasilar opacities may represent atelectasis, aspiration or infection. Background emphysema. No pneumothorax or significant pleural effusion.  "   There was scarring on both lungs - if having respiratory symptoms may need to follow up with PCP, otherwise nothing in the abdomen was noted. Suggest incentive spirometry, tylenol as needed .   Please C/c my note to PCP   Dr Jonathon Bellows MD,MRCP [facility]) Gastroenterology/Hepatology Pager: [phone]

## 2020-12-18 NOTE — Telephone Encounter (Signed)
The Mobic may cause nausea and abdominal discomfort. Would suggest to avoid that if you can and take something like Tylenol which would not affect the stomach as much. If she is not on Carafate can take Carafate 3 times daily as needed for abdominal discomfort.

## 2020-12-22 NOTE — Telephone Encounter (Signed)
Called patient and informed her of Dr. Vicente Males recommendations. Patient states she will start back taking the Bentyl and stop taking the Mobic like Dr. Vicente Males suggested. Pt verbalized understanding.

## 2021-02-18 ENCOUNTER — Telehealth: Payer: Self-pay

## 2021-02-18 NOTE — Telephone Encounter (Signed)
Faxed medical record to [facility] per request from 12/06/2019 to 12/04/2020

## 2021-03-30 ENCOUNTER — Other Ambulatory Visit: Payer: Self-pay | Admitting: Specialist

## 2021-03-30 DIAGNOSIS — R0609 Other forms of dyspnea: Secondary | ICD-10-CM

## 2021-03-30 DIAGNOSIS — R059 Cough, unspecified: Secondary | ICD-10-CM

## 2021-03-30 DIAGNOSIS — R0600 Dyspnea, unspecified: Secondary | ICD-10-CM

## 2021-03-30 DIAGNOSIS — J449 Chronic obstructive pulmonary disease, unspecified: Secondary | ICD-10-CM

## 2021-04-08 ENCOUNTER — Ambulatory Visit: Payer: 59

## 2021-04-19 ENCOUNTER — Other Ambulatory Visit: Payer: Self-pay

## 2021-04-19 ENCOUNTER — Ambulatory Visit
Admission: RE | Admit: 2021-04-19 | Discharge: 2021-04-19 | Disposition: A | Discharge status: Home / Self Care | Payer: 59 | Source: Ambulatory Visit | Attending: Specialist | Admitting: Specialist

## 2021-04-19 DIAGNOSIS — J449 Chronic obstructive pulmonary disease, unspecified: Secondary | ICD-10-CM | POA: Diagnosis not present

## 2021-04-19 DIAGNOSIS — R0600 Dyspnea, unspecified: Secondary | ICD-10-CM | POA: Diagnosis present

## 2021-04-19 DIAGNOSIS — R059 Cough, unspecified: Secondary | ICD-10-CM | POA: Insufficient documentation

## 2021-04-19 DIAGNOSIS — R0609 Other forms of dyspnea: Secondary | ICD-10-CM

## 2021-06-22 ENCOUNTER — Other Ambulatory Visit (HOSPITAL_COMMUNITY): Payer: Self-pay | Admitting: Unknown Physician Specialty

## 2021-06-22 ENCOUNTER — Other Ambulatory Visit: Payer: Self-pay | Admitting: Unknown Physician Specialty

## 2021-06-22 DIAGNOSIS — H9042 Sensorineural hearing loss, unilateral, left ear, with unrestricted hearing on the contralateral side: Secondary | ICD-10-CM

## 2021-07-06 ENCOUNTER — Ambulatory Visit: Payer: 59

## 2021-07-19 ENCOUNTER — Other Ambulatory Visit: Payer: Self-pay | Admitting: Family Medicine

## 2021-07-19 DIAGNOSIS — R1032 Left lower quadrant pain: Secondary | ICD-10-CM

## 2021-07-20 ENCOUNTER — Ambulatory Visit
Admission: RE | Admit: 2021-07-20 | Discharge: 2021-07-20 | Disposition: A | Discharge status: Home / Self Care | Payer: Medicare HMO | Source: Ambulatory Visit | Attending: Family Medicine | Admitting: Family Medicine

## 2021-07-20 ENCOUNTER — Other Ambulatory Visit: Payer: Self-pay

## 2021-07-20 ENCOUNTER — Ambulatory Visit: Admission: RE | Admit: 2021-07-20 | Payer: Medicare HMO | Source: Ambulatory Visit

## 2021-07-20 DIAGNOSIS — R1032 Left lower quadrant pain: Secondary | ICD-10-CM | POA: Insufficient documentation

## 2021-07-20 MED ORDER — IOHEXOL 300 MG/ML  SOLN
150.0000 mL | Freq: Once | INTRAMUSCULAR | Status: AC | PRN
  Administered 2021-07-20: 100 mL via INTRAVENOUS

## 2021-08-11 ENCOUNTER — Other Ambulatory Visit: Payer: Self-pay

## 2021-08-11 DIAGNOSIS — Z1231 Encounter for screening mammogram for malignant neoplasm of breast: Secondary | ICD-10-CM

## 2021-08-23 ENCOUNTER — Other Ambulatory Visit: Payer: Self-pay

## 2021-08-23 ENCOUNTER — Ambulatory Visit
Admission: RE | Admit: 2021-08-23 | Discharge: 2021-08-23 | Disposition: A | Discharge status: Home / Self Care | Payer: Medicare HMO | Source: Ambulatory Visit

## 2021-08-23 DIAGNOSIS — Z1231 Encounter for screening mammogram for malignant neoplasm of breast: Secondary | ICD-10-CM | POA: Insufficient documentation
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
## 2022-07-01 ENCOUNTER — Other Ambulatory Visit: Payer: Self-pay | Admitting: Pediatrics

## 2022-07-01 DIAGNOSIS — Z1231 Encounter for screening mammogram for malignant neoplasm of breast: Secondary | ICD-10-CM

## 2022-07-01 DIAGNOSIS — Z780 Asymptomatic menopausal state: Secondary | ICD-10-CM

## 2022-08-24 ENCOUNTER — Ambulatory Visit
Admission: RE | Admit: 2022-08-24 | Discharge: 2022-08-24 | Disposition: A | Discharge status: Home / Self Care | Payer: Medicare HMO | Source: Ambulatory Visit | Attending: Pediatrics | Admitting: Pediatrics

## 2022-08-24 DIAGNOSIS — Z780 Asymptomatic menopausal state: Secondary | ICD-10-CM | POA: Insufficient documentation

## 2022-08-24 DIAGNOSIS — Z1231 Encounter for screening mammogram for malignant neoplasm of breast: Secondary | ICD-10-CM | POA: Insufficient documentation

## 2023-01-24 ENCOUNTER — Ambulatory Visit
Admission: RE | Admit: 2023-01-24 | Discharge: 2023-01-24 | Disposition: A | Discharge status: Home / Self Care | Payer: Medicare HMO | Source: Ambulatory Visit | Attending: Gastroenterology | Admitting: Gastroenterology

## 2023-01-24 ENCOUNTER — Other Ambulatory Visit: Payer: Self-pay | Admitting: Gastroenterology

## 2023-01-24 DIAGNOSIS — R1031 Right lower quadrant pain: Secondary | ICD-10-CM | POA: Diagnosis present

## 2023-01-24 DIAGNOSIS — R197 Diarrhea, unspecified: Secondary | ICD-10-CM

## 2023-01-24 DIAGNOSIS — K625 Hemorrhage of anus and rectum: Secondary | ICD-10-CM

## 2023-01-24 DIAGNOSIS — R1032 Left lower quadrant pain: Secondary | ICD-10-CM

## 2023-01-24 MED ORDER — IOHEXOL 300 MG/ML  SOLN
100.0000 mL | Freq: Once | INTRAMUSCULAR | Status: AC | PRN
  Administered 2023-01-24: 100 mL via INTRAVENOUS

## 2023-06-16 ENCOUNTER — Other Ambulatory Visit: Payer: Self-pay | Admitting: Physician Assistant

## 2023-06-16 DIAGNOSIS — Z1231 Encounter for screening mammogram for malignant neoplasm of breast: Secondary | ICD-10-CM

## 2023-08-28 ENCOUNTER — Ambulatory Visit
Admission: RE | Admit: 2023-08-28 | Discharge: 2023-08-28 | Disposition: A | Discharge status: Home / Self Care | Payer: Medicare HMO | Source: Ambulatory Visit | Attending: Physician Assistant | Admitting: Physician Assistant

## 2023-08-28 DIAGNOSIS — Z1231 Encounter for screening mammogram for malignant neoplasm of breast: Secondary | ICD-10-CM | POA: Diagnosis present

## 2023-08-29 ENCOUNTER — Encounter: Payer: Self-pay | Admitting: Physician Assistant

## 2023-09-07 ENCOUNTER — Other Ambulatory Visit: Payer: Self-pay | Admitting: Physician Assistant

## 2023-09-07 DIAGNOSIS — R928 Other abnormal and inconclusive findings on diagnostic imaging of breast: Secondary | ICD-10-CM

## 2023-09-18 ENCOUNTER — Ambulatory Visit
Admission: RE | Admit: 2023-09-18 | Discharge: 2023-09-18 | Disposition: A | Discharge status: Home / Self Care | Payer: Medicare HMO | Source: Ambulatory Visit | Attending: Physician Assistant | Admitting: Physician Assistant

## 2023-09-18 DIAGNOSIS — R928 Other abnormal and inconclusive findings on diagnostic imaging of breast: Secondary | ICD-10-CM

## 2023-10-23 ENCOUNTER — Other Ambulatory Visit
Admission: RE | Admit: 2023-10-23 | Discharge: 2023-10-23 | Disposition: A | Discharge status: Home / Self Care | Payer: Medicare HMO | Source: Ambulatory Visit | Attending: Specialist | Admitting: Specialist

## 2023-10-23 DIAGNOSIS — R071 Chest pain on breathing: Secondary | ICD-10-CM | POA: Diagnosis present

## 2023-10-23 LAB — D-DIMER, QUANTITATIVE: D-Dimer, Quant: 0.88 ug{FEU}/mL — ABNORMAL HIGH (ref 0.00–0.50)

## 2023-10-25 ENCOUNTER — Observation Stay
Admission: EM | Admit: 2023-10-25 | Discharge: 2023-10-26 | Disposition: A | Discharge status: Home / Self Care | Payer: Medicare HMO | Attending: Emergency Medicine | Admitting: Emergency Medicine

## 2023-10-25 ENCOUNTER — Emergency Department: Payer: Medicare HMO

## 2023-10-25 ENCOUNTER — Other Ambulatory Visit: Payer: Self-pay

## 2023-10-25 ENCOUNTER — Encounter: Payer: Self-pay | Admitting: Emergency Medicine

## 2023-10-25 DIAGNOSIS — Z79899 Other long term (current) drug therapy: Secondary | ICD-10-CM | POA: Diagnosis not present

## 2023-10-25 DIAGNOSIS — R911 Solitary pulmonary nodule: Secondary | ICD-10-CM | POA: Diagnosis not present

## 2023-10-25 DIAGNOSIS — J449 Chronic obstructive pulmonary disease, unspecified: Secondary | ICD-10-CM | POA: Diagnosis not present

## 2023-10-25 DIAGNOSIS — R778 Other specified abnormalities of plasma proteins: Secondary | ICD-10-CM | POA: Diagnosis not present

## 2023-10-25 DIAGNOSIS — K219 Gastro-esophageal reflux disease without esophagitis: Secondary | ICD-10-CM | POA: Diagnosis not present

## 2023-10-25 DIAGNOSIS — R7989 Other specified abnormal findings of blood chemistry: Principal | ICD-10-CM

## 2023-10-25 DIAGNOSIS — Z85828 Personal history of other malignant neoplasm of skin: Secondary | ICD-10-CM | POA: Insufficient documentation

## 2023-10-25 DIAGNOSIS — R0609 Other forms of dyspnea: Secondary | ICD-10-CM | POA: Diagnosis not present

## 2023-10-25 DIAGNOSIS — R0789 Other chest pain: Secondary | ICD-10-CM | POA: Diagnosis present

## 2023-10-25 DIAGNOSIS — K5100 Ulcerative (chronic) pancolitis without complications: Secondary | ICD-10-CM | POA: Diagnosis not present

## 2023-10-25 DIAGNOSIS — R079 Chest pain, unspecified: Principal | ICD-10-CM | POA: Diagnosis present

## 2023-10-25 DIAGNOSIS — F17210 Nicotine dependence, cigarettes, uncomplicated: Secondary | ICD-10-CM | POA: Diagnosis not present

## 2023-10-25 DIAGNOSIS — Z8719 Personal history of other diseases of the digestive system: Secondary | ICD-10-CM

## 2023-10-25 LAB — BASIC METABOLIC PANEL
Anion gap: 9 (ref 5–15)
BUN: 20 mg/dL (ref 8–23)
CO2: 27 mmol/L (ref 22–32)
Calcium: 8.9 mg/dL (ref 8.9–10.3)
Chloride: 102 mmol/L (ref 98–111)
Creatinine, Ser: 0.82 mg/dL (ref 0.44–1.00)
GFR, Estimated: >60 mL/min (ref >60)
Glucose, Bld: 139 mg/dL — ABNORMAL HIGH (ref 70–99)
Potassium: 3.7 mmol/L (ref 3.5–5.1)
Sodium: 138 mmol/L (ref 135–145)

## 2023-10-25 LAB — CBC
HCT: 42 % (ref 36.0–46.0)
Hemoglobin: 13.7 g/dL (ref 12.0–15.0)
MCH: 29 pg (ref 26.0–34.0)
MCHC: 32.6 g/dL (ref 30.0–36.0)
MCV: 89 fL (ref 80.0–100.0)
Platelets: 228 10*3/uL (ref 150–400)
RBC: 4.72 MIL/uL (ref 3.87–5.11)
RDW: 12.7 % (ref 11.5–15.5)
WBC: 8 10*3/uL (ref 4.0–10.5)
nRBC: 0 % (ref 0.0–0.2)

## 2023-10-25 LAB — TROPONIN I (HIGH SENSITIVITY)
Troponin I (High Sensitivity): 5 ng/L (ref <18)
Troponin I (High Sensitivity): 20 ng/L — ABNORMAL HIGH (ref <18)

## 2023-10-25 MED ORDER — IPRATROPIUM-ALBUTEROL 0.5-2.5 (3) MG/3ML IN SOLN
3.0000 mL | Freq: Four times a day (QID) | RESPIRATORY_TRACT | Status: DC
Start: 2023-10-26 — End: 2023-10-26
  Administered 2023-10-26 (×3): 3 mL via RESPIRATORY_TRACT
  Filled 2023-10-25 (×3): qty 3

## 2023-10-25 MED ORDER — ALBUTEROL SULFATE (2.5 MG/3ML) 0.083% IN NEBU: 2.5000 mg | INHALATION_SOLUTION | RESPIRATORY_TRACT | Status: DC | PRN

## 2023-10-25 MED ORDER — GUAIFENESIN ER 600 MG PO TB12
1200.0000 mg | ORAL_TABLET | Freq: Two times a day (BID) | ORAL | Status: DC
  Administered 2023-10-25 – 2023-10-26 (×2): 1200 mg via ORAL
  Filled 2023-10-25 (×2): qty 2

## 2023-10-25 MED ORDER — ONDANSETRON HCL 4 MG/2ML IJ SOLN: 4.0000 mg | Freq: Four times a day (QID) | INTRAMUSCULAR | Status: DC | PRN

## 2023-10-25 MED ORDER — ACETAMINOPHEN 325 MG PO TABS: 650.0000 mg | ORAL_TABLET | Freq: Four times a day (QID) | ORAL | Status: DC | PRN

## 2023-10-25 MED ORDER — SODIUM CHLORIDE 0.9 % IV SOLN
500.0000 mg | INTRAVENOUS | Status: DC
  Filled 2023-10-25: qty 5

## 2023-10-25 MED ORDER — ACETAMINOPHEN 650 MG RE SUPP: 650.0000 mg | Freq: Four times a day (QID) | RECTAL | Status: DC | PRN

## 2023-10-25 MED ORDER — ASPIRIN 81 MG PO CHEW
324.0000 mg | CHEWABLE_TABLET | Freq: Once | ORAL | Status: AC
  Administered 2023-10-25: 324 mg via ORAL
  Filled 2023-10-25: qty 4

## 2023-10-25 MED ORDER — AZITHROMYCIN 500 MG PO TABS: 500.0000 mg | ORAL_TABLET | Freq: Every day | ORAL | Status: DC

## 2023-10-25 MED ORDER — HYDROCODONE-ACETAMINOPHEN 5-325 MG PO TABS: 1.0000 | ORAL_TABLET | ORAL | Status: DC | PRN

## 2023-10-25 MED ORDER — IOHEXOL 350 MG/ML SOLN
75.0000 mL | Freq: Once | INTRAVENOUS | Status: AC | PRN
  Administered 2023-10-25: 75 mL via INTRAVENOUS

## 2023-10-25 MED ORDER — ONDANSETRON HCL 4 MG PO TABS: 4.0000 mg | ORAL_TABLET | Freq: Four times a day (QID) | ORAL | Status: DC | PRN

## 2023-10-25 MED ORDER — PREDNISONE 20 MG PO TABS
40.0000 mg | ORAL_TABLET | Freq: Every day | ORAL | Status: DC
  Administered 2023-10-26: 40 mg via ORAL
  Filled 2023-10-25 (×2): qty 2

## 2023-10-25 MED ORDER — ENOXAPARIN SODIUM 40 MG/0.4ML IJ SOSY
40.0000 mg | PREFILLED_SYRINGE | INTRAMUSCULAR | Status: DC
  Administered 2023-10-25: 40 mg via SUBCUTANEOUS
  Filled 2023-10-25: qty 0.4

## 2023-10-25 MED ORDER — PANTOPRAZOLE SODIUM 40 MG PO TBEC
40.0000 mg | DELAYED_RELEASE_TABLET | Freq: Every day | ORAL | Status: DC
  Administered 2023-10-26: 40 mg via ORAL
  Filled 2023-10-25: qty 1

## 2023-10-25 NOTE — Assessment & Plan Note (Addendum)
Chronic respiratory failure Patient currently on nighttime O2.  Previously required Trelegy but was intolerant of it Treated for COPD flare at pulmonology office on 11/18 (treatment included Breztri, Singulair, Daliresp, Delsym cough syrup, Mucinex, Augmentin and prednisone taper) Patient is not currently wheezing Continue home inhalers, Singulair and Daliresp as well as Mucinex DuoNebs as needed Supplemental O2 Patient might need BiPAP/trilogy at night but did not tolerate in the past Can consider pulmonology consult
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
## 2023-10-25 NOTE — Assessment & Plan Note (Addendum)
Elevated troponin Suspect noncardiac and related to demand ischemia from coughing and recent COPD flare Patient was seen by pulmonologist on 11/18 and chest pain was deemed to be muscular related to coughing CTA chest negative for PE shows a RLL nodule with recommended CTA follow-up as well as emphysema Troponin 5--> 20, and EKG nonacute Will trend troponin to peak Can consider inpatient versus outpatient stress test Will get an echo

## 2023-10-25 NOTE — Assessment & Plan Note (Signed)
No acute issues.

## 2023-10-25 NOTE — ED Triage Notes (Signed)
Patient to ED via POV for abnormal lab- d dimer elevated. PCP concerned with possible blood clot. Patient has been feeling more SOB- wearing 2L [place] and states she normally only wears at night but is where during the day. Also having chest pain into her back.

## 2023-10-25 NOTE — Assessment & Plan Note (Signed)
History of hospitalization for colitis in 2020.  Follow-up colonoscopy was normal

## 2023-10-25 NOTE — Assessment & Plan Note (Signed)
Former smoker Incidental finding on CTA chest 10-month follow-up with CT recommended

## 2023-10-25 NOTE — Assessment & Plan Note (Signed)
Suspect secondary to COPD.  CHF not suspected at this time Will add on BNP Chest CT showed no evidence of pulmonary vascular congestion and patient is clinically euvolemic Will get an echo

## 2023-10-25 NOTE — ED Provider Notes (Signed)
   [facility] Provider Note    Event Date/Time   First MD Initiated Contact with Patient 10/25/23 2151     (approximate)   History   Abnormal Lab   HPI  Natasha [name] is a 67 y.o. female who comes in with concerns for shortness of breath.  Normally wears 2 L at nighttime but has been wearing it during the day.  Patient is having some chest pain as well.   Physical Exam   Triage Vital Signs: ED Triage Vitals [10/25/23 1517]  Encounter Vitals Group     BP 117/69     Systolic BP Percentile      Diastolic BP Percentile      Pulse Rate 82     Resp 18     Temp 98.7 F (37.1 C)     Temp Source Oral     SpO2 95 %     Weight 145 lb (65.8 kg)     Height 5\' 2"  (1.575 m)     Head Circumference      Peak Flow      Pain Score 6     Pain Loc      Pain Education      Exclude from Growth Chart     Most recent vital signs: Vitals:   10/25/23 1517 10/25/23 2020  BP: 117/69 117/79  Pulse: 82 75  Resp: 18 17  Temp: 98.7 F (37.1 C) 98.2 F (36.8 C)  SpO2: 95% 92%     General: Awake, no distress.  CV:  Good peripheral perfusion.  Resp:  Normal effort.  Abd:  No distention.  Other:     ED Results / Procedures / Treatments   Labs (all labs ordered are listed, but only abnormal results are displayed) Labs Reviewed  BASIC METABOLIC PANEL - Abnormal; Notable for the following components:      Result Value   Glucose, Bld 139 (*)    All other components within normal limits  TROPONIN I (HIGH SENSITIVITY) - Abnormal; Notable for the following components:   Troponin I (High Sensitivity) 20 (*)    All other components within normal limits  CBC  TROPONIN I (HIGH SENSITIVITY)     EKG  My interpretation of EKG:    RADIOLOGY I have reviewed the xray personally and agree with radiology read   PROCEDURES:  Critical Care performed: {CriticalCareYesNo:19197::"Yes, see critical care procedure note(s)","No"}  Procedures   MEDICATIONS  ORDERED IN ED: Medications  iohexol (OMNIPAQUE) 350 MG/ML injection 75 mL (75 mLs Intravenous Contrast Given 10/25/23 1752)     IMPRESSION / MDM / ASSESSMENT AND PLAN / ED COURSE  I reviewed the triage vital signs and the nursing notes.   Patient's presentation is most consistent with {EM COPA:27473}   Yes CT imaging is reassuring IMPRESSION: No evidence of pulmonary embolus.   1.4 cm nodular area in the posteromedial right lower lobe which could reflect scarring, but warrants follow-up CT in 6 months to assess for stability.   Emphysema (ICD10-J43.9).  The patient is on the cardiac monitor to evaluate for evidence of arrhythmia and/or significant heart rate changes.      FINAL CLINICAL IMPRESSION(S) / ED DIAGNOSES   Final diagnoses:  None     Rx / DC Orders   ED Discharge Orders     None        Note:  This document was prepared using Dragon voice recognition software and may include unintentional dictation errors.

## 2023-10-26 ENCOUNTER — Encounter: Payer: Self-pay | Admitting: Internal Medicine

## 2023-10-26 ENCOUNTER — Observation Stay
Admit: 2023-10-26 | Discharge: 2023-10-26 | Disposition: A | Discharge status: Home / Self Care | Payer: Medicare HMO | Attending: Internal Medicine | Admitting: Internal Medicine

## 2023-10-26 DIAGNOSIS — J449 Chronic obstructive pulmonary disease, unspecified: Secondary | ICD-10-CM

## 2023-10-26 DIAGNOSIS — R079 Chest pain, unspecified: Secondary | ICD-10-CM | POA: Diagnosis not present

## 2023-10-26 DIAGNOSIS — R7989 Other specified abnormal findings of blood chemistry: Secondary | ICD-10-CM | POA: Diagnosis not present

## 2023-10-26 LAB — HEPATIC FUNCTION PANEL
ALT: 15 U/L (ref 0–44)
AST: 18 U/L (ref 15–41)
Albumin: 3.7 g/dL (ref 3.5–5.0)
Alkaline Phosphatase: 78 U/L (ref 38–126)
Bilirubin, Direct: <0.1 mg/dL (ref 0.0–0.2)
Total Bilirubin: 0.6 mg/dL (ref <1.2)
Total Protein: 7 g/dL (ref 6.5–8.1)

## 2023-10-26 LAB — ECHOCARDIOGRAM COMPLETE
AR max vel: 2.93 cm2
AV Area VTI: 3.46 cm2
AV Area mean vel: 2.96 cm2
AV Mean grad: 4 mm[Hg]
AV Peak grad: 7.4 mm[Hg]
Ao pk vel: 1.36 m/s
Area-P 1/2: 2.9 cm2
Height: 62 in
MV VTI: 3.25 cm2
S' Lateral: 2.5 cm
Weight: 2320 [oz_av]

## 2023-10-26 LAB — HIV ANTIBODY (ROUTINE TESTING W REFLEX): HIV Screen 4th Generation wRfx: NONREACTIVE

## 2023-10-26 LAB — LIPASE, BLOOD: Lipase: 28 U/L (ref 11–51)

## 2023-10-26 MED ORDER — RISAQUAD PO CAPS
2.0000 | ORAL_CAPSULE | Freq: Three times a day (TID) | ORAL | Status: DC
  Administered 2023-10-26 (×2): 2 via ORAL
  Filled 2023-10-26 (×2): qty 2

## 2023-10-26 MED ORDER — AMOXICILLIN-POT CLAVULANATE 875-125 MG PO TABS
1.0000 | ORAL_TABLET | Freq: Two times a day (BID) | ORAL | Status: DC
  Administered 2023-10-26 (×2): 1 via ORAL
  Filled 2023-10-26 (×2): qty 1
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
## 2023-10-26 NOTE — Progress Notes (Signed)
*  PRELIMINARY RESULTS* Echocardiogram 2D Echocardiogram has been performed.  Natasha [name] 10/26/2023, 10:36 AM

## 2023-10-26 NOTE — ED Notes (Signed)
MD at bedside. 

## 2023-10-26 NOTE — ED Notes (Signed)
Admitting provider at bedside.

## 2023-10-26 NOTE — ED Notes (Signed)
Pt verbalizes understanding of discharge instructions. Opportunity for questioning and answers were provided. Pt discharged from ED to home with significant other.   ? ?

## 2023-10-26 NOTE — Discharge Instructions (Signed)
Patient advised to finish up the antibiotic prescription and prednisone that was given to her by her pulmonologist Dr. Meredeth Ide

## 2024-02-05 ENCOUNTER — Other Ambulatory Visit: Payer: Self-pay | Admitting: Family Medicine

## 2024-02-05 DIAGNOSIS — N6312 Unspecified lump in the right breast, upper inner quadrant: Secondary | ICD-10-CM

## 2024-02-06 ENCOUNTER — Ambulatory Visit
Admission: RE | Admit: 2024-02-06 | Discharge: 2024-02-06 | Disposition: A | Discharge status: Home / Self Care | Source: Ambulatory Visit | Attending: Family Medicine | Admitting: Family Medicine

## 2024-02-06 DIAGNOSIS — N6312 Unspecified lump in the right breast, upper inner quadrant: Secondary | ICD-10-CM | POA: Diagnosis present

## 2024-02-13 ENCOUNTER — Encounter

## 2024-02-13 ENCOUNTER — Other Ambulatory Visit

## 2024-05-10 ENCOUNTER — Other Ambulatory Visit: Payer: Self-pay | Admitting: Specialist

## 2024-05-10 DIAGNOSIS — R911 Solitary pulmonary nodule: Secondary | ICD-10-CM

## 2024-05-20 ENCOUNTER — Other Ambulatory Visit: Payer: Self-pay | Admitting: Physician Assistant

## 2024-05-20 DIAGNOSIS — R1032 Left lower quadrant pain: Secondary | ICD-10-CM

## 2024-05-22 ENCOUNTER — Ambulatory Visit
Admission: RE | Admit: 2024-05-22 | Discharge: 2024-05-22 | Disposition: A | Discharge status: Home / Self Care | Source: Ambulatory Visit | Attending: Specialist | Admitting: Specialist

## 2024-05-22 DIAGNOSIS — R1032 Left lower quadrant pain: Secondary | ICD-10-CM | POA: Insufficient documentation

## 2024-05-22 DIAGNOSIS — R911 Solitary pulmonary nodule: Secondary | ICD-10-CM | POA: Insufficient documentation

## 2024-05-22 MED ORDER — IOHEXOL 300 MG/ML  SOLN
100.0000 mL | Freq: Once | INTRAMUSCULAR | Status: AC | PRN
  Administered 2024-05-22: 100 mL via INTRAVENOUS

## 2024-07-14 ENCOUNTER — Other Ambulatory Visit: Payer: Self-pay | Admitting: Medical Genetics

## 2024-07-22 ENCOUNTER — Other Ambulatory Visit
Admission: RE | Admit: 2024-07-22 | Discharge: 2024-07-22 | Disposition: A | Discharge status: Home / Self Care | Payer: Self-pay | Source: Ambulatory Visit | Attending: Medical Genetics | Admitting: Medical Genetics

## 2024-07-30 LAB — GENECONNECT MOLECULAR SCREEN: Genetic Analysis Overall Interpretation: NEGATIVE

## 2024-08-27 ENCOUNTER — Other Ambulatory Visit: Payer: Self-pay | Admitting: Physician Assistant

## 2024-08-27 DIAGNOSIS — Z1231 Encounter for screening mammogram for malignant neoplasm of breast: Secondary | ICD-10-CM

## 2024-09-26 ENCOUNTER — Ambulatory Visit
Admission: RE | Admit: 2024-09-26 | Discharge: 2024-09-26 | Disposition: A | Discharge status: Home / Self Care | Source: Ambulatory Visit | Attending: Physician Assistant | Admitting: Physician Assistant

## 2024-09-26 DIAGNOSIS — Z1231 Encounter for screening mammogram for malignant neoplasm of breast: Secondary | ICD-10-CM | POA: Insufficient documentation

## 2024-10-16 ENCOUNTER — Ambulatory Visit
Admission: EM | Admit: 2024-10-16 | Discharge: 2024-10-16 | Disposition: A | Discharge status: Home / Self Care | Attending: Family Medicine | Admitting: Family Medicine

## 2024-10-16 ENCOUNTER — Encounter: Payer: Self-pay | Admitting: Emergency Medicine

## 2024-10-16 DIAGNOSIS — Z23 Encounter for immunization: Secondary | ICD-10-CM | POA: Diagnosis not present

## 2024-10-16 DIAGNOSIS — S81802A Unspecified open wound, left lower leg, initial encounter: Secondary | ICD-10-CM

## 2024-10-16 DIAGNOSIS — W540XXA Bitten by dog, initial encounter: Secondary | ICD-10-CM

## 2024-10-16 MED ORDER — TETANUS-DIPHTH-ACELL PERTUSSIS 5-2-15.5 LF-MCG/0.5 IM SUSP
0.5000 mL | Freq: Once | INTRAMUSCULAR | Status: AC
  Administered 2024-10-16: 0.5 mL via INTRAMUSCULAR

## 2024-10-16 MED ORDER — AMOXICILLIN-POT CLAVULANATE 875-125 MG PO TABS: 1.0000 | ORAL_TABLET | Freq: Two times a day (BID) | ORAL | 0 refills | Status: AC

## 2024-10-16 MED ORDER — FLUCONAZOLE 150 MG PO TABS: 150.0000 mg | ORAL_TABLET | ORAL | 0 refills | Status: AC

## 2024-10-16 NOTE — Discharge Instructions (Addendum)
 Stop by the pharmacy to pick up your prescriptions.  Watch for signs of infection. Schedule a follow up visit to have the wound re-evaluated in 1 week. You wound will take a while to heal (weeks).  Follow up with your family friend to be sure the dog's rabies vaccines are up to date.  If not, the dog should be monitored for 10 days or you could consider the rabies vaccine.   Call the [facility] at [facility] to schedule an appointment, if wound do not look like its healing.  Address: [address], [place], [place] [address] Phone: [phone]  Contact a doctor if: You have more redness, swelling, or pain around your wound. Your wound feels warm to the touch. You have a fever or chills. You have a general feeling of sickness (malaise). You feel like you may vomit. You vomit. You have pain that does not get better.  Get help right away if: You have a red streak going away from your wound. You have any of these coming from your wound: Non-clear fluid. More blood. Pus or a bad smell. You have trouble moving your injured area. You lose feeling (have numbness) or feel tingling anywhere on your body.

## 2024-10-16 NOTE — ED Provider Notes (Addendum)
 [facility]    CSN: [id number] Arrival date & time: 10/16/24  1501      History   Chief Complaint Chief Complaint  Patient presents with   Animal Bite    HPI Natasha [name] is a 68 y.o. female.   HPI  Natasha [name] presents for dog bite.  She took her cousin who is visiting back to the apartment where her son lives. The son's girlfriends dog bite her as she stepped in the door to got to the bathroom. The dog looked like a Chihuahua. The dog bite her on her right leg on the outside.       Past Medical History:  Diagnosis Date   Acid reflux    Cancer (HCC)    squamous cell and melanoma   GERD (gastroesophageal reflux disease)    History of 2019 novel coronavirus disease (COVID-19) 10/28/2019   Hyperlipidemia    Osteoporosis    Thrombophlebitis 1983   Unspecified venous (peripheral) insufficiency     Patient Active Problem List   Diagnosis Date Noted   Chest pain 10/25/2023   Dyspnea on exertion 10/25/2023   Elevated troponin 10/25/2023   History of colitis 10/25/2023   Pulmonary nodule, right 10/25/2023   COPD (chronic obstructive pulmonary disease) with acute bronchitis (HCC) 12/07/2020   Ischemic colitis    Acute colitis 06/10/2019   COPD, 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   GERD (gastroesophageal reflux disease) 07/05/2011   Osteopenia 07/05/2011   Pure hypercholesterolemia 07/05/2011   Smoker 07/05/2011    Past Surgical History:  Procedure Laterality Date   CHOLECYSTECTOMY  2007   COLONOSCOPY  2008   COLONOSCOPY WITH PROPOFOL  N/A 11/03/2020   Procedure: COLONOSCOPY WITH PROPOFOL ;  Surgeon: Therisa Bi, MD;  Location: [facility] ENDOSCOPY;  Service: Gastroenterology;  Laterality: N/A;   ESOPHAGOGASTRODUODENOSCOPY (EGD) WITH PROPOFOL  N/A 11/03/2020   Procedure: ESOPHAGOGASTRODUODENOSCOPY (EGD) WITH PROPOFOL ;  Surgeon: Therisa Bi, MD;  Location: [facility] ENDOSCOPY;  Service: Gastroenterology;  Laterality: N/A;   LAPAROSCOPY  2007    OB History   No obstetric  history on file.      Home Medications    Prior to Admission medications   Medication Sig Start Date End Date Taking? Authorizing Provider  amoxicillin -clavulanate (AUGMENTIN ) 875-125 MG tablet Take 1 tablet by mouth every 12 (twelve) hours. 10/16/24  Yes Lien Lyman, DO  ezetimibe (ZETIA) 10 MG tablet Take 10 mg by mouth daily. 07/16/24 07/16/25 Yes [provider]  albuterol  (PROVENTIL  HFA;VENTOLIN  HFA) 108 (90 Base) MCG/ACT inhaler Inhale into the lungs every 6 (six) hours as needed for wheezing or shortness of breath.    [provider]  cetirizine (ZYRTEC) 10 MG tablet Take 10 mg by mouth at bedtime.    [provider]  famotidine  (PEPCID ) 10 MG tablet Take 10 mg by mouth.    [provider]  ipratropium-albuterol  (DUONEB) 0.5-2.5 (3) MG/3ML SOLN Inhale into the lungs.    [provider]  ketoconazole (NIZORAL) 2 % shampoo Apply 1 Application topically 2 (two) times a week.    [provider]  montelukast (SINGULAIR) 10 MG tablet Take 1 tablet by mouth at bedtime. 08/16/23   [provider]  omeprazole  (PRILOSEC) 40 MG capsule Take 1 capsule (40 mg total) by mouth daily. 10/14/20   Therisa Bi, MD  ondansetron  (ZOFRAN ) 4 MG tablet Take by mouth. 10/27/20   [provider]  predniSONE  (DELTASONE ) 10 MG tablet Take 10 mg by mouth 2 (two) times daily with a meal. 2 pills q  day 10/23/23   [provider]  Probiotic Product (PROBIOMAX DAILY DF) CAPS  10/19/20   [provider]  Probiotic Product (PROBIOTIC PO) Take by mouth.    [provider]  roflumilast (DALIRESP) 500 MCG TABS tablet Take 500 mcg by mouth daily. 10/11/23   [provider]  TRELEGY ELLIPTA 100-62.5-25 MCG/ACT AEPB Inhale 1 puff into the lungs daily.    [provider]  Calcium  Citrate-Vitamin D  (CALCIUM  + D PO) Take 1 tablet by mouth daily.   02/12/20  [provider]    Family History Family  History  Problem Relation Age of Onset   Breast cancer Mother 14    Social History Social History   Tobacco Use   Smoking status: Former    Current packs/day: 1.00    Average packs/day: 1 pack/day for 30.0 years (30.0 ttl pk-yrs)    Types: Cigarettes   Smokeless tobacco: Never  Vaping Use   Vaping status: Never Used  Substance Use Topics   Alcohol use: Yes   Drug use: No     Allergies   Azithromycin , Clindamycin, Codeine, Sulfamethoxazole -trimethoprim , Budesonide-formoterol fumarate, Ciprofloxacin , Hydrocodone -acetaminophen , and Other   Review of Systems Review of Systems :negative unless otherwise stated in HPI.      Physical Exam Triage Vital Signs ED Triage Vitals  Encounter Vitals Group     BP 10/16/24 1519 (!) 145/95     Girls Systolic BP Percentile --      Girls Diastolic BP Percentile --      Boys Systolic BP Percentile --      Boys Diastolic BP Percentile --      Pulse Rate 10/16/24 1519 94     Resp 10/16/24 1519 17     Temp 10/16/24 1519 98.1 F (36.7 C)     Temp Source 10/16/24 1519 Oral     SpO2 10/16/24 1519 94 %     Weight 10/16/24 1515 140 lb (63.5 kg)     Height --      Head Circumference --      Peak Flow --      Pain Score 10/16/24 1516 6     Pain Loc --      Pain Education --      Exclude from Growth Chart --    No data found.  Updated Vital Signs BP (!) 145/95 (BP Location: Left Arm)   Pulse 94   Temp 98.1 F (36.7 C) (Oral)   Resp 17   Wt 63.5 kg   SpO2 94%   BMI 25.61 kg/m   Visual Acuity Right Eye Distance:   Left Eye Distance:   Bilateral Distance:    Right Eye Near:   Left Eye Near:    Bilateral Near:     Physical Exam  GEN: alert, well appearing female, in no acute distress  EYES: no scleral injection or discharge CV: regular rate, strong DP pulse  RESP: no increased work of breathing MSK: no extremity edema  NEURO: alert, moves all extremities appropriately SKIN: warm and dry; 7 x 4 cm full thickness skin  avulsion on right lower leg, brisk cap refill, bleeding controlled         UC Treatments / Results  Labs (all labs ordered are listed, but only abnormal results are displayed) Labs Reviewed - No data to display  EKG   Radiology No results found.  Procedures Procedures (including critical care time)  Medications Ordered in UC Medications  Tdap (ADACEL) injection 0.5 mL (  0.5 mLs Intramuscular Given 10/16/24 1558)    Initial Impression / Assessment and Plan / UC Course  I have reviewed the triage vital signs and the nursing notes.  Pertinent labs & imaging results that were available during my care of the patient were reviewed by me and considered in my medical decision making (see chart for details).     Dog bite Patient is a 69 y.o. female who presents for dog bite by a family friends dog.  Wounds cleansed and covered with sterile dressing. Topical antibiotic applied.  Open lacerations to heal by secondary intention.   This is not a known animal but friend reports vaccines are up-to-date.  No need for rabies vaccine or immunoglobulin needed at this time. Tetanus given today.  Over-the-counter analgesics as needed. Treat with Augmentin .  Diflucan for antibiotic associated yeast infection prevention. Advised to watch for signs of infection.     Final Clinical Impressions(s) / UC Diagnoses   Final diagnoses:  Dog bite, initial encounter  Avulsion of skin of left lower leg, initial encounter     Discharge Instructions      Stop by the pharmacy to pick up your prescriptions.  Watch for signs of infection. Schedule a follow up visit to have the wound re-evaluated in 1 week. You wound will take a while to heal (weeks).  Follow up with your family friend to be sure the dog's rabies vaccines are up to date.  If not, the dog should be monitored for 10 days or you could consider the rabies vaccine.   Call the [facility] at [facility] to schedule  an appointment, if wound do not look like its healing.  Address: [address], [place], [place] [address] Phone: [phone]  Contact a doctor if: You have more redness, swelling, or pain around your wound. Your wound feels warm to the touch. You have a fever or chills. You have a general feeling of sickness (malaise). You feel like you may vomit. You vomit. You have pain that does not get better.  Get help right away if: You have a red streak going away from your wound. You have any of these coming from your wound: Non-clear fluid. More blood. Pus or a bad smell. You have trouble moving your injured area. You lose feeling (have numbness) or feel tingling anywhere on your body.      ED Prescriptions     Medication Sig Dispense Auth. Provider   amoxicillin -clavulanate (AUGMENTIN ) 875-125 MG tablet Take 1 tablet by mouth every 12 (twelve) hours. 14 tablet Newel Oien, DO   fluconazole (DIFLUCAN) 150 MG tablet Take 1 tablet (150 mg total) by mouth every 3 (three) days for 2 doses. 2 tablet Kriste Berth, DO      PDMP not reviewed this encounter.              Kriste Berth, DO 10/24/24 1411    Kriste Berth, DO 10/24/24 1412

## 2024-10-16 NOTE — ED Triage Notes (Signed)
 Pt was bit by a family's member dog to her right shin today. Pt last tetanus was 05/09/2018.

## 2024-11-07 ENCOUNTER — Encounter: Attending: Physician Assistant | Admitting: Physician Assistant

## 2024-11-07 DIAGNOSIS — L97812 Non-pressure chronic ulcer of other part of right lower leg with fat layer exposed: Secondary | ICD-10-CM | POA: Diagnosis not present

## 2024-11-07 DIAGNOSIS — S81811A Laceration without foreign body, right lower leg, initial encounter: Secondary | ICD-10-CM | POA: Insufficient documentation

## 2024-11-07 DIAGNOSIS — I87331 Chronic venous hypertension (idiopathic) with ulcer and inflammation of right lower extremity: Secondary | ICD-10-CM | POA: Diagnosis present

## 2024-11-07 DIAGNOSIS — W540XXA Bitten by dog, initial encounter: Secondary | ICD-10-CM | POA: Diagnosis not present

## 2024-11-14 ENCOUNTER — Encounter: Admitting: Internal Medicine

## 2024-11-15 ENCOUNTER — Encounter: Admitting: Internal Medicine

## 2024-11-15 DIAGNOSIS — I87331 Chronic venous hypertension (idiopathic) with ulcer and inflammation of right lower extremity: Secondary | ICD-10-CM | POA: Diagnosis not present

## 2024-11-21 ENCOUNTER — Encounter: Admitting: Internal Medicine

## 2024-11-21 DIAGNOSIS — I87331 Chronic venous hypertension (idiopathic) with ulcer and inflammation of right lower extremity: Secondary | ICD-10-CM | POA: Diagnosis not present

## 2024-12-06 ENCOUNTER — Encounter: Payer: Self-pay | Attending: Physician Assistant | Admitting: Physician Assistant

## 2024-12-06 DIAGNOSIS — I87331 Chronic venous hypertension (idiopathic) with ulcer and inflammation of right lower extremity: Secondary | ICD-10-CM | POA: Insufficient documentation

## 2024-12-06 DIAGNOSIS — W540XXA Bitten by dog, initial encounter: Secondary | ICD-10-CM | POA: Insufficient documentation

## 2024-12-06 DIAGNOSIS — S81851A Open bite, right lower leg, initial encounter: Secondary | ICD-10-CM | POA: Insufficient documentation

## 2024-12-06 DIAGNOSIS — L97812 Non-pressure chronic ulcer of other part of right lower leg with fat layer exposed: Secondary | ICD-10-CM | POA: Diagnosis not present

## 2024-12-20 ENCOUNTER — Encounter: Payer: Self-pay | Admitting: Physician Assistant

## 2024-12-20 DIAGNOSIS — S81851A Open bite, right lower leg, initial encounter: Secondary | ICD-10-CM | POA: Diagnosis not present

## 2024-12-25 ENCOUNTER — Other Ambulatory Visit: Payer: Self-pay | Admitting: Family Medicine

## 2024-12-25 DIAGNOSIS — Z780 Asymptomatic menopausal state: Secondary | ICD-10-CM

## 2025-01-23 ENCOUNTER — Ambulatory Visit
